# Patient Record
Sex: Male | Born: 1940
Health system: Southern US, Community
[De-identification: ages and names within clinical notes are randomized; demographics above are authoritative.]

## PROBLEM LIST (undated history)

## (undated) DIAGNOSIS — I2510 Atherosclerotic heart disease of native coronary artery without angina pectoris: Secondary | ICD-10-CM

## (undated) DIAGNOSIS — J189 Pneumonia, unspecified organism: Secondary | ICD-10-CM

## (undated) DIAGNOSIS — D696 Thrombocytopenia, unspecified: Secondary | ICD-10-CM

## (undated) DIAGNOSIS — E785 Hyperlipidemia, unspecified: Secondary | ICD-10-CM

## (undated) DIAGNOSIS — N529 Male erectile dysfunction, unspecified: Secondary | ICD-10-CM

## (undated) DIAGNOSIS — I340 Nonrheumatic mitral (valve) insufficiency: Secondary | ICD-10-CM

## (undated) DIAGNOSIS — G4700 Insomnia, unspecified: Secondary | ICD-10-CM

## (undated) DIAGNOSIS — I10 Essential (primary) hypertension: Secondary | ICD-10-CM

## (undated) DIAGNOSIS — I5042 Chronic combined systolic (congestive) and diastolic (congestive) heart failure: Secondary | ICD-10-CM

## (undated) DIAGNOSIS — R0609 Other forms of dyspnea: Secondary | ICD-10-CM

## (undated) DIAGNOSIS — I428 Other cardiomyopathies: Secondary | ICD-10-CM

## (undated) DIAGNOSIS — C61 Malignant neoplasm of prostate: Secondary | ICD-10-CM

## (undated) DIAGNOSIS — M1990 Unspecified osteoarthritis, unspecified site: Secondary | ICD-10-CM

## (undated) HISTORY — DX: Other cardiomyopathies: I42.8

## (undated) HISTORY — PX: CARDIAC CATHETERIZATION: SHX172

## (undated) HISTORY — DX: Chronic combined systolic (congestive) and diastolic (congestive) heart failure: I50.42

## (undated) HISTORY — DX: Nonrheumatic mitral (valve) insufficiency: I34.0

## (undated) HISTORY — PX: APPENDECTOMY: SHX54

## (undated) HISTORY — DX: Atherosclerotic heart disease of native coronary artery without angina pectoris: I25.10

## (undated) HISTORY — PX: COLONOSCOPY W/ POLYPECTOMY: SHX1380

---

## 2014-03-16 DIAGNOSIS — C801 Malignant (primary) neoplasm, unspecified: Secondary | ICD-10-CM

## 2014-03-16 DIAGNOSIS — C61 Malignant neoplasm of prostate: Secondary | ICD-10-CM

## 2014-03-16 HISTORY — DX: Malignant (primary) neoplasm, unspecified: C80.1

## 2014-03-16 HISTORY — DX: Malignant neoplasm of prostate: C61

## 2018-03-16 HISTORY — PX: JOINT REPLACEMENT: SHX530

## 2018-03-16 HISTORY — PX: HERNIA REPAIR: SHX51

## 2020-01-10 ENCOUNTER — Other Ambulatory Visit: Payer: Self-pay | Admitting: Student

## 2020-01-10 ENCOUNTER — Other Ambulatory Visit (HOSPITAL_COMMUNITY): Payer: Self-pay | Admitting: Student

## 2020-01-10 DIAGNOSIS — M19012 Primary osteoarthritis, left shoulder: Secondary | ICD-10-CM

## 2020-01-18 ENCOUNTER — Other Ambulatory Visit: Payer: Self-pay

## 2020-01-18 ENCOUNTER — Ambulatory Visit
Admission: RE | Admit: 2020-01-18 | Discharge: 2020-01-18 | Disposition: A | Discharge status: Home / Self Care | Payer: Medicare HMO | Source: Ambulatory Visit | Attending: Student | Admitting: Student

## 2020-01-18 DIAGNOSIS — M19012 Primary osteoarthritis, left shoulder: Secondary | ICD-10-CM | POA: Insufficient documentation

## 2020-02-19 ENCOUNTER — Other Ambulatory Visit: Payer: Self-pay | Admitting: Surgery

## 2020-02-28 ENCOUNTER — Ambulatory Visit: Payer: Medicare HMO | Admitting: Internal Medicine

## 2020-02-28 ENCOUNTER — Other Ambulatory Visit: Payer: Self-pay

## 2020-02-28 ENCOUNTER — Encounter
Admission: RE | Admit: 2020-02-28 | Discharge: 2020-02-28 | Disposition: A | Discharge status: Home / Self Care | Payer: Medicare HMO | Source: Ambulatory Visit | Attending: Surgery | Admitting: Surgery

## 2020-02-28 ENCOUNTER — Encounter: Payer: Self-pay | Admitting: Urgent Care

## 2020-02-28 ENCOUNTER — Encounter: Payer: Self-pay | Admitting: Internal Medicine

## 2020-02-28 VITALS — BP 130/72 | HR 81 | Ht 67.0 in | Wt 183.4 lb

## 2020-02-28 DIAGNOSIS — Z01810 Encounter for preprocedural cardiovascular examination: Secondary | ICD-10-CM | POA: Insufficient documentation

## 2020-02-28 DIAGNOSIS — I493 Ventricular premature depolarization: Secondary | ICD-10-CM | POA: Diagnosis not present

## 2020-02-28 DIAGNOSIS — M19012 Primary osteoarthritis, left shoulder: Secondary | ICD-10-CM | POA: Diagnosis not present

## 2020-02-28 DIAGNOSIS — I10 Essential (primary) hypertension: Secondary | ICD-10-CM | POA: Insufficient documentation

## 2020-02-28 DIAGNOSIS — R0600 Dyspnea, unspecified: Secondary | ICD-10-CM

## 2020-02-28 DIAGNOSIS — R9431 Abnormal electrocardiogram [ECG] [EKG]: Secondary | ICD-10-CM

## 2020-02-28 DIAGNOSIS — R0609 Other forms of dyspnea: Secondary | ICD-10-CM | POA: Insufficient documentation

## 2020-02-28 DIAGNOSIS — Z01818 Encounter for other preprocedural examination: Secondary | ICD-10-CM | POA: Diagnosis not present

## 2020-02-28 HISTORY — DX: Hyperlipidemia, unspecified: E78.5

## 2020-02-28 HISTORY — DX: Malignant neoplasm of prostate: C61

## 2020-02-28 HISTORY — DX: Essential (primary) hypertension: I10

## 2020-02-28 HISTORY — DX: Thrombocytopenia, unspecified: D69.6

## 2020-02-28 LAB — CBC WITH DIFFERENTIAL/PLATELET
Abs Immature Granulocytes: 0.03 10*3/uL (ref 0.00–0.07)
Basophils Absolute: 0 10*3/uL (ref 0.0–0.1)
Basophils Relative: 1 %
Eosinophils Absolute: 0.3 10*3/uL (ref 0.0–0.5)
Eosinophils Relative: 4 %
HCT: 40 % (ref 39.0–52.0)
Hemoglobin: 13.5 g/dL (ref 13.0–17.0)
Immature Granulocytes: 0 %
Lymphocytes Relative: 22 %
Lymphs Abs: 1.6 10*3/uL (ref 0.7–4.0)
MCH: 31.8 pg (ref 26.0–34.0)
MCHC: 33.8 g/dL (ref 30.0–36.0)
MCV: 94.3 fL (ref 80.0–100.0)
Monocytes Absolute: 0.7 10*3/uL (ref 0.1–1.0)
Monocytes Relative: 9 %
Neutro Abs: 4.8 10*3/uL (ref 1.7–7.7)
Neutrophils Relative %: 64 %
Platelets: 141 10*3/uL — ABNORMAL LOW (ref 150–400)
RBC: 4.24 MIL/uL (ref 4.22–5.81)
RDW: 13.9 % (ref 11.5–15.5)
WBC: 7.5 10*3/uL (ref 4.0–10.5)
nRBC: 0 % (ref 0.0–0.2)

## 2020-02-28 LAB — COMPREHENSIVE METABOLIC PANEL
ALT: 14 U/L (ref 0–44)
AST: 16 U/L (ref 15–41)
Albumin: 3.4 g/dL — ABNORMAL LOW (ref 3.5–5.0)
Alkaline Phosphatase: 55 U/L (ref 38–126)
Anion gap: 6 (ref 5–15)
BUN: 19 mg/dL (ref 8–23)
CO2: 27 mmol/L (ref 22–32)
Calcium: 9.1 mg/dL (ref 8.9–10.3)
Chloride: 107 mmol/L (ref 98–111)
Creatinine, Ser: 0.75 mg/dL (ref 0.61–1.24)
GFR, Estimated: >60 mL/min (ref >60)
Glucose, Bld: 99 mg/dL (ref 70–99)
Potassium: 3.8 mmol/L (ref 3.5–5.1)
Sodium: 140 mmol/L (ref 135–145)
Total Bilirubin: 0.9 mg/dL (ref 0.3–1.2)
Total Protein: 6.4 g/dL — ABNORMAL LOW (ref 6.5–8.1)

## 2020-02-28 LAB — URINALYSIS, ROUTINE W REFLEX MICROSCOPIC
Bilirubin Urine: NEGATIVE
Glucose, UA: NEGATIVE mg/dL
Hgb urine dipstick: NEGATIVE
Ketones, ur: NEGATIVE mg/dL
Leukocytes,Ua: NEGATIVE
Nitrite: NEGATIVE
Protein, ur: NEGATIVE mg/dL
Specific Gravity, Urine: 1.011 (ref 1.005–1.030)
pH: 6 (ref 5.0–8.0)

## 2020-02-28 LAB — TYPE AND SCREEN
ABO/RH(D): A NEG
Antibody Screen: NEGATIVE

## 2020-02-28 LAB — SURGICAL PCR SCREEN
MRSA, PCR: NEGATIVE
Staphylococcus aureus: NEGATIVE

## 2020-02-28 NOTE — Patient Instructions (Addendum)
Your procedure is scheduled on: Thursday March 07, 2020. Report to Day Surgery inside [facility] 2nd floor (stop by registration desk first). To find out your arrival time please call [phone] between 1PM - 3PM on Wednesday March 06, 2020.  Remember: Instructions that are not followed completely may result in serious medical risk,  up to and including death, or upon the discretion of your surgeon and anesthesiologist your  surgery may need to be rescheduled.     _X__ 1. Do not eat food after midnight the night before your procedure.                 No chewing gum or hard candies. You may drink clear liquids up to 2 hours                 before you are scheduled to arrive for your surgery- DO not drink clear                 liquids within 2 hours of the start of your surgery.                 Clear Liquids include:  water, apple juice without pulp, clear Gatorade, G2 or                  Gatorade Zero (avoid Red/Purple/Blue), Black Coffee or Tea (Do not add                 anything to coffee or tea).  __X__2.   Complete the "Ensure Clear Pre-surgery Clear Carbohydrate Drink" provided to you, 2 hours before arrival. **If you are diabetic you will be provided with an alternative drink, Gatorade Zero or G2.  __X__3.  On the morning of surgery brush your teeth with toothpaste and water, you                may rinse your mouth with mouthwash if you wish.  Do not swallow any toothpaste of mouthwash.     _X__ 4.  No Alcohol for 24 hours before or after surgery.   _X__ 5.  Do Not Smoke or use e-cigarettes For 24 Hours Prior to Your Surgery.                 Do not use any chewable tobacco products for at least 6 hours prior to                 Surgery.  _X__  6.  Do not use any recreational drugs (marijuana, cocaine, heroin, ecstasy, MDMA or other)                For at least one week prior to your surgery.  Combination of these drugs with anesthesia                 May have life threatening results.  __X__  7.  Notify your doctor if there is any change in your medical condition      (cold, fever, infections).     Do not wear jewelry, make-up, hairpins, clips or nail polish. Do not wear lotions, powders, or perfumes. You may wear deodorant. Do not shave 48 hours prior to surgery. Men may shave face and neck. Do not bring valuables to the hospital.    [facility] is not responsible for any belongings or valuables.  Contacts, dentures or bridgework may not be worn into surgery. Leave your suitcase in the car. After surgery it may be  brought to your room. For patients admitted to the hospital, discharge time is determined by your treatment team.   Patients discharged the day of surgery will not be allowed to drive home.   Make arrangements for someone to be with you for the first 24 hours of your Same Day Discharge.    Please read over the following fact sheets that you were given:     __X__ Take these medicines the morning of surgery with A SIP OF WATER:    1. None   ____ Fleet Enema (as directed)   __X__ Use CHG Soap (or wipes) as directed  ____ Use Benzoyl Peroxide Gel as instructed  __X__ Use nasal on the day of surgery  fluticasone (FLONASE) 50 MCG/ACT nasal spray   ____ Stop metformin 2 days prior to surgery    ____ Take 1/2 of usual insulin dose the night before surgery. No insulin the morning          of surgery.   __x__ Stop aspirin EC 81 MG as instructed by your doctor.   __x__ Stop Anti-inflammatories such as Ibuprofen, Aleve, Advil, naproxen and or BC powders.   __x__ Stop supplements until after surgery.    __x__ Do not start any herbal supplements before your procedure.   If you have any questions regarding your pre-procedure instructions,  Please call Pre-admit Testing at [phone].

## 2020-02-28 NOTE — Patient Instructions (Signed)
Medication Instructions:  Your physician recommends that you continue on your current medications as directed. Please refer to the Current Medication list given to you today.  *If you need a refill on your cardiac medications before your next appointment, please call your pharmacy*   Lab Work: None  If you have labs (blood work) drawn today and your tests are completely normal, you will receive your results only by: Marland Kitchen MyChart Message (if you have MyChart) OR . A paper copy in the mail If you have any lab test that is abnormal or we need to change your treatment, we will call you to review the results.   Testing/Procedures: 1- Your physician has requested that you have an echocardiogram.  This week or early next week. Surgery is on 03/07/20 . Echocardiography is a painless test that uses sound waves to create images of your heart. It provides your doctor with information about the size and shape of your heart and how well your heart's chambers and valves are working. This procedure takes approximately one hour. There are no restrictions for this procedure. There is a possibility that an IV may need to be started during your test to inject an image enhancing agent. This is done to obtain more optimal pictures of your heart. Therefore we ask that you do at least drink some water prior to coming in to hydrate your veins.    2- Your physician has requested that you have a lexiscan myoview.  This week or early next week.   [place]  Your caregiver has ordered a Stress Test with nuclear imaging. The purpose of this test is to evaluate the blood supply to your heart muscle. This procedure is referred to as a "Non-Invasive Stress Test." This is because other than having an IV started in your vein, nothing is inserted or "invades" your body. Cardiac stress tests are done to find areas of poor blood flow to the heart by determining the extent of coronary artery disease (CAD). Some patients exercise  on a treadmill, which naturally increases the blood flow to your heart, while others who are  unable to walk on a treadmill due to physical limitations have a pharmacologic/chemical stress agent called Lexiscan . This medicine will mimic walking on a treadmill by temporarily increasing your coronary blood flow.   Please note: these test may take anywhere between 2-4 hours to complete  PLEASE REPORT TO [place] AT THE FIRST DESK WILL DIRECT YOU WHERE TO GO  Date of Procedure:_____________________________________  Arrival Time for Procedure:______________________________   PLEASE NOTIFY THE OFFICE AT LEAST 24 HOURS IN ADVANCE IF YOU ARE UNABLE TO KEEP YOUR APPOINTMENT.  [phone] AND  PLEASE NOTIFY NUCLEAR MEDICINE AT [facility] AT LEAST 24 HOURS IN ADVANCE IF YOU ARE UNABLE TO KEEP YOUR APPOINTMENT. [phone]  How to prepare for your Myoview test:  1. Do not eat or drink after midnight 2. No caffeine for 24 hours prior to test 3. No smoking 24 hours prior to test. 4. Your medication may be taken with water.  If your doctor stopped a medication because of this test, do not take that medication. 5. Please wear a short sleeve shirt. 6. No perfume, cologne or lotion. 7. Wear comfortable walking shoes.   Follow-Up: At [facility], you and your health needs are our priority.  As part of our continuing mission to provide you with exceptional heart care, we have created designated Provider Care Teams.  These Care Teams  include your primary Cardiologist (physician) and Advanced Practice Providers (APPs -  Physician Assistants and Nurse Practitioners) who all work together to provide you with the care you need, when you need it.  We recommend signing up for the patient portal called "MyChart".  Sign up information is provided on this After Visit Summary.  MyChart is used to connect with patients for Virtual Visits (Telemedicine).  Patients are able to view  lab/test results, encounter notes, upcoming appointments, etc.  Non-urgent messages can be sent to your provider as well.   To learn more about what you can do with MyChart, go to [email or web address].    Your next appointment:   After testing  The format for your next appointment:   In Person  Provider:   You may see DR Harrell Gave END or one of the following Advanced Practice Providers on your designated Care Team:    Murray Hodgkins, NP  Christell Faith, PA-C  Marrianne Mood, PA-C  Cadence Kathlen Mody, [place]  Laurann Montana, NP   Cardiac Nuclear Scan A cardiac nuclear scan is a test that measures blood flow to the heart when a person is resting and when he or she is exercising. The test looks for problems such as:  Not enough blood reaching a portion of the heart.  The heart muscle not working normally. You may need this test if:  You have heart disease.  You have had abnormal lab results.  You have had heart surgery or a balloon procedure to open up blocked arteries (angioplasty).  You have chest pain.  You have shortness of breath. In this test, a radioactive dye (tracer) is injected into your bloodstream. After the tracer has traveled to your heart, an imaging device is used to measure how much of the tracer is absorbed by or distributed to various areas of your heart. This procedure is usually done at a hospital and takes 2-4 hours. Tell a health care provider about:  Any allergies you have.  All medicines you are taking, including vitamins, herbs, eye drops, creams, and over-the-counter medicines.  Any problems you or family members have had with anesthetic medicines.  Any blood disorders you have.  Any surgeries you have had.  Any medical conditions you have.  Whether you are pregnant or may be pregnant. What are the risks? Generally, this is a safe procedure. However, problems may occur, including:  Serious chest pain and heart attack. This is only a  risk if the stress portion of the test is done.  Rapid heartbeat.  Sensation of warmth in your chest. This usually passes quickly.  Allergic reaction to the tracer. What happens before the procedure?  Ask your health care provider about changing or stopping your regular medicines. This is especially important if you are taking diabetes medicines or blood thinners.  Follow instructions from your health care provider about eating or drinking restrictions.  Remove your jewelry on the day of the procedure. What happens during the procedure?  An IV will be inserted into one of your veins.  Your health care provider will inject a small amount of radioactive tracer through the IV.  You will wait for 20-40 minutes while the tracer travels through your bloodstream.  Your heart activity will be monitored with an electrocardiogram (ECG).  You will lie down on an exam table.  Images of your heart will be taken for about 15-20 minutes.  You may also have a stress test. For this test, one of the following may be done: ?  You will exercise on a treadmill or stationary bike. While you exercise, your heart's activity will be monitored with an ECG, and your blood pressure will be checked. ? You will be given medicines that will increase blood flow to parts of your heart. This is done if you are unable to exercise.  When blood flow to your heart has peaked, a tracer will again be injected through the IV.  After 20-40 minutes, you will get back on the exam table and have more images taken of your heart.  Depending on the type of tracer used, scans may need to be repeated 3-4 hours later.  Your IV line will be removed when the procedure is over. The procedure may vary among health care providers and hospitals. What happens after the procedure?  Unless your health care provider tells you otherwise, you may return to your normal schedule, including diet, activities, and medicines.  Unless your  health care provider tells you otherwise, you may increase your fluid intake. This will help to flush the contrast dye from your body. Drink enough fluid to keep your urine pale yellow.  Ask your health care provider, or the department that is doing the test: ? When will my results be ready? ? How will I get my results? Summary  A cardiac nuclear scan measures the blood flow to the heart when a person is resting and when he or she is exercising.  Tell your health care provider if you are pregnant.  Before the procedure, ask your health care provider about changing or stopping your regular medicines. This is especially important if you are taking diabetes medicines or blood thinners.  After the procedure, unless your health care provider tells you otherwise, increase your fluid intake. This will help flush the contrast dye from your body.  After the procedure, unless your health care provider tells you otherwise, you may return to your normal schedule, including diet, activities, and medicines. This information is not intended to replace advice given to you by your health care provider. Make sure you discuss any questions you have with your health care provider. Document Revised: 08/16/2017 Document Reviewed: 08/16/2017 Elsevier Patient Education  [employer].

## 2020-02-28 NOTE — Progress Notes (Signed)
New Outpatient Visit Date: 02/28/2020  Referring Provider: Honor Loh, NP [facility]  Chief Complaint: Preoperative evaluation  HPI:  [name] is a 79 y.o. male who is being seen today for preoperative cardiovascular evaluation at the request of Honor Loh, NP. He has a history of hypertension, hyperlipidemia, and prostate cancer.  [name] is scheduled to undergo left total shoulder arthroplasty with Dr. Roland Rack next week (03/07/2020).  He was evaluated in the preoperative anesthesiology clinic today, where he mentioned an episode of exertional dyspnea while at the gym as well as associated chest tightness.  He was also found to have an abnormal EKG, prompting urgent cardiology referral.  [name] reports feeling fairly well.  He exercises regularly on an elliptical trainer.  He typically does not have limiting symptoms but notes one episode a few weeks ago where he started off faster than normal and felt like he was quite out of breath.  On my questioning, he denies chest pain/pressure at that time and on other occasions.  He also denies palpitations, lightheadedness, and edema.  He reports undergoing an exercise tolerance test 30 to 35 years ago and believes it was normal.  He has undergone several prior surgeries without cardiac complications.  --------------------------------------------------------------------------------------------------  Cardiovascular History & Procedures: Cardiovascular Problems:  Dyspnea on exertion  Abnormal EKG  Risk Factors:  Hypertension, hyperlipidemia, male gender, and age greater than 47  Cath/PCI:  None  CV Surgery:  None  EP Procedures and Devices:  None  Non-Invasive Evaluation(s):  None available.  Patient reports normal ETT 30-35 years ago.  Recent CV Pertinent Labs: Lab Results  Component Value Date   K 3.8 02/28/2020   BUN 19 02/28/2020   CREATININE 0.75 02/28/2020     --------------------------------------------------------------------------------------------------  Past Medical History:  Diagnosis Date  . Cancer [facility]) 2016   prostate cancer   . HLD (hyperlipidemia)   . Hypertension   . Prostate cancer ([place])   . Thrombocytopenia ([place])     Past Surgical History:  Procedure Laterality Date  . HERNIA REPAIR  2020  . JOINT REPLACEMENT  2020    Current Meds  Medication Sig  . acetaminophen (TYLENOL) 500 MG tablet Take 1,000 mg by mouth every 6 (six) hours as needed (for pain.).  Marland Kitchen aspirin EC 81 MG tablet Take 81 mg by mouth daily. Swallow whole.  . calcium carbonate (OSCAL) 1500 (600 Ca) MG TABS tablet Take 600 mg by mouth daily.  . Coenzyme Q10 (COQ10) 50 MG CAPS Take 50 mg by mouth at bedtime.  . fluticasone (FLONASE) 50 MCG/ACT nasal spray Place 1 spray into both nostrils daily.  Marland Kitchen lisinopril-hydrochlorothiazide (ZESTORETIC) 20-12.5 MG tablet Take 1 tablet by mouth daily.  . Multiple Vitamin (MULTIVITAMIN WITH MINERALS) TABS tablet Take 1 tablet by mouth daily.  . Multiple Vitamins-Minerals (MEGAVITE FRUITS & VEGGIES) TABS Take by mouth.  . simvastatin (ZOCOR) 20 MG tablet Take 20 mg by mouth at bedtime.    Allergies: Amoxicillin  Social History   Tobacco Use  . Smoking status: Never Smoker  . Smokeless tobacco: Never Used  Substance Use Topics  . Alcohol use: Never  . Drug use: Never    Family History  Problem Relation Age of Onset  . Heart Problems Mother   . Heart Problems Sister     Review of Systems: A 12-system review of systems was performed and was negative except as noted in the HPI.  --------------------------------------------------------------------------------------------------  Physical Exam: BP 130/72 (BP Location: Right Arm,  Patient Position: Sitting, Cuff Size: Normal)   Pulse 81   Ht 5\' 7"  (1.702 m)   Wt 183 lb 6 oz (83.2 kg)   SpO2 98%   BMI 28.72 kg/m   General: NAD.  Accompanied by his  wife. HEENT: No conjunctival pallor or scleral icterus. Facemask in place. Neck: Supple without lymphadenopathy, thyromegaly, JVD, or HJR. No carotid bruit. Lungs: Normal work of breathing. Clear to auscultation bilaterally without wheezes or crackles. Heart: Regular rate and rhythm with occasional extrasystoles.  No rubs or gallops.  Nondisplaced PMI. Abd: Bowel sounds present. Soft, NT/ND without hepatosplenomegaly Ext: No lower extremity edema. Radial, PT, and DP pulses are 2+ bilaterally Skin: Warm and dry without rash. Neuro: CNIII-XII intact. Strength and fine-touch sensation intact in upper and lower extremities bilaterally. Psych: Normal mood and affect.  EKG: Normal sinus rhythm with PVCs, first-degree AV block, left axis deviation, and nonspecific intraventricular conduction delay.  Lab Results  Component Value Date   WBC 7.5 02/28/2020   HGB 13.5 02/28/2020   HCT 40.0 02/28/2020   MCV 94.3 02/28/2020   PLT 141 (L) 02/28/2020    Lab Results  Component Value Date   NA 140 02/28/2020   K 3.8 02/28/2020   CL 107 02/28/2020   CO2 27 02/28/2020   BUN 19 02/28/2020   CREATININE 0.75 02/28/2020   GLUCOSE 99 02/28/2020   ALT 14 02/28/2020    No results found for: CHOL, HDL, LDLCALC, LDLDIRECT, TRIG, CHOLHDL   --------------------------------------------------------------------------------------------------  ASSESSMENT AND PLAN: Dyspnea on exertion and abnormal EKG: Overall, [name] has fairly good functional capacity but notes an episode of exertional dyspnea while exercising a few weeks ago.  His examination today is notable for occasional extrasystoles, which likely reflect PVCs as noted on today's EKG.  His EKG also shows first-degree AV block, left axis deviation, and nonspecific intraventricular conduction delay.  Tracing is similar to the EKG from earlier today; unfortunately no older tracings are available for direct comparison.  EKG through [facility] in  02/2019 makes note of sinus rhythm with frequent PVCs, bifascicular block, LVH, and possible septal infarct.  Given [name]'s multiple cardiac risk factors and abnormal EKG, I have recommended obtaining an echocardiogram and myocardial perfusion stress test before proceeding with elective surgery.  In the meantime, he should continue his current medications.  Preoperative cardiovascular risk assessment: [name] is usually able to perform more than 4 METS, though he notes an episode of significant dyspnea while exercising, as outlined above.  In light of this and his abnormal EKG, I have recommended that we obtain a transthoracic echocardiogram and pharmacologic myocardial perfusion stress test prior to proceeding with elective surgery.  If there are no high risk features on either study, I think it would be reasonable for [name] to proceed without additional testing/intervention.  Hypertension: Blood pressure upper normal today.  I will defer ongoing management to his PCP.  Follow-up: Return to clinic as needed based on results of aforementioned testing.  Nelva Bush, MD 02/28/2020 8:34 PM

## 2020-02-28 NOTE — Progress Notes (Signed)
  [facility] Perioperative Services: Pre-Admission/Anesthesia Testing     Date: 02/28/20  Name: Larry Allen MRN:   [id number]  Re: Need for cardiac clearance prior to surgery    Case: [id number] Date/Time: 03/07/20 0715   Procedure: TOTAL SHOULDER ARTHROPLASTY (Left Shoulder)   Anesthesia type: Choice   Pre-op diagnosis: Primary osteoarthritis of left shoulder M19.012   Location: [facility] OR ROOM 03 / [facility]ESTHESIA [facility]   Surgeons: Corky Mull, MD    Patient scheduled for the above procedure on 03/07/2020 with Dr. Milagros Evener. As part of his pre-operative assessment, patient presented to PAT today (02/28/2020) and a 12 lead ECG was performed.    ECG revealed NSR at a rate of 73 bpm with frequent PVCs, LVH with QRS widening, and a repolarization abnormality.    No previous tracings available for review, however there is narrative interpretation of a preoperative ECG performed at [facility] on 03/02/2019 that showed NSR with frequent PVCs, RBBB, LAFB, LVH with secondary repolarization abnormality, and a possible septal infarction.   Patient denies PMH significant for any known cardiovascular issues; does have HTN and HLD.    Patient reports exertional dyspnea and chest "tightness" if with rapid overexertion. He reports that if he walks on his treadmill too quickly, the aforementioned symptoms are elicited, however if he "starts off slow" the symptoms are reported to be much milder in severity. Patient denies having ever been seen in consult by cardiology in the past.   While the interpretation of his ECG does not seem to be significantly changed from the one that he has in the past, the fact that he is symptomatic with exertion is concerning, especially given the fact that he is scheduled for an elective orthopedic procedure in the near future. Call placed to [facility] in efforts to secure patient an appointment to be seen for further evaluation and  preoperative clearance. Patient able to be seen TODAY at 1515 by Dr. Harrell Gave End. Copy of this note forwarded to attending orthopedic surgeon Roland Rack, MD) in efforts to keep him apprised of the details related to this patient and his perioperative evaluation. No changes being made to the surgical schedule at this time, however with that being said, cardiology may elect to pursue further functional and perfusion studies given the patient's age and symptom constellation. Will plan on communicating further details to surgeon's office following evaluation by cardiology.       Honor Loh, MSN, APRN, FNP-C, CEN [facility]  Peri-operative Services Nurse Practitioner Phone: [phone] 02/28/20 12:34 PM

## 2020-02-29 ENCOUNTER — Ambulatory Visit (INDEPENDENT_AMBULATORY_CARE_PROVIDER_SITE_OTHER): Payer: Medicare HMO

## 2020-02-29 DIAGNOSIS — R9431 Abnormal electrocardiogram [ECG] [EKG]: Secondary | ICD-10-CM | POA: Diagnosis not present

## 2020-02-29 DIAGNOSIS — R0609 Other forms of dyspnea: Secondary | ICD-10-CM

## 2020-02-29 DIAGNOSIS — R0600 Dyspnea, unspecified: Secondary | ICD-10-CM

## 2020-02-29 LAB — ECHOCARDIOGRAM COMPLETE
Area-P 1/2: 3.4 cm2
S' Lateral: 3.7 cm

## 2020-02-29 NOTE — Progress Notes (Addendum)
  [facility] Perioperative Services: Pre-Admission/Anesthesia Testing     Date: 02/29/20  Name: Larry Allen MRN:   [id number]  Re: Need for cardiac testing/clearance prior to elective surgery   Case: [id number] Date/Time: 03/07/20 0715   Procedure: TOTAL SHOULDER ARTHROPLASTY (Left Shoulder)   Anesthesia type: Choice   Pre-op diagnosis: Primary osteoarthritis of left shoulder M19.012   Location: [facility] OR ROOM 03 / [place] ORS FOR ANESTHESIA GROUP   Surgeons: Corky Mull, MD    Patient scheduled to undergo the above procedure on 03/07/2020 with Dr. Milagros Evener.  In preparation for his procedure, patient was seen in the PAT clinic and found to have an abnormal EKG.  He was subsequently referred to cardiology for further evaluation.  In efforts to proceed with his surgery as planned, patient was seen and expedited consult on 02/28/2020 by Dr. Harrell Gave End; notes reviewed.  At the time of his clinic visit, patient reporting that he is doing well overall from a cardiovascular standpoint.  He did advise cardiology of the episodes of exertional shortness of breath and chest tightness associated with exercise. DASI defined functional capacity is normally > 4 METS. Patient denied symptoms at rest.  No recent palpitations, vertiginous symptoms, or peripheral edema.  ECG repeated in the office revealing normal sinus rhythm with PVCs, first-degree AV block, left axis deviation, and a nonspecific IVCD.  Patient with multiple cardiac risk factors, therefore recommendations were for TEE and myocardial perfusion imaging study prior to proceeding with elective surgery.  At this time, cardiology testing and evaluation is scheduled as follows:   TTE scheduled for 02/29/2020   Myocardial perfusion imaging study scheduled for 03/04/2020   Follow-up with outpatient cardiology to review results and recommendations on 03/05/2020  Plan of care will be communicated to primary attending  surgeons office.  I will continue to follow patient throughout course of preoperative cardiovascular testing and evaluation and communicate any changes to Dr. Nicholaus Bloom office. No changes have been made to the surgical schedule at this point. Assuming normal cardiovascular testing results, this patient should be suitable to proceed with planned surgical intervention on 03/07/2020.   Honor Loh, MSN, APRN, FNP-C, CEN [facility]  Peri-operative Services Nurse Practitioner Phone: [phone] 02/29/20 8:36 AM

## 2020-03-01 ENCOUNTER — Telehealth: Payer: Self-pay | Admitting: *Deleted

## 2020-03-01 ENCOUNTER — Other Ambulatory Visit: Payer: Medicare HMO

## 2020-03-01 NOTE — Telephone Encounter (Signed)
Patient called back and he verbalized understanding of his echo results.

## 2020-03-04 ENCOUNTER — Ambulatory Visit
Admission: RE | Admit: 2020-03-04 | Discharge: 2020-03-04 | Disposition: A | Discharge status: Home / Self Care | Payer: Medicare HMO | Source: Ambulatory Visit | Attending: Internal Medicine | Admitting: Internal Medicine

## 2020-03-04 ENCOUNTER — Other Ambulatory Visit: Payer: Self-pay

## 2020-03-04 DIAGNOSIS — R0600 Dyspnea, unspecified: Secondary | ICD-10-CM | POA: Insufficient documentation

## 2020-03-04 DIAGNOSIS — R9431 Abnormal electrocardiogram [ECG] [EKG]: Secondary | ICD-10-CM | POA: Insufficient documentation

## 2020-03-04 DIAGNOSIS — R0609 Other forms of dyspnea: Secondary | ICD-10-CM

## 2020-03-04 LAB — NM MYOCAR MULTI W/SPECT W/WALL MOTION / EF
Estimated workload: 1 METS
Exercise duration (min): 0 min
Exercise duration (sec): 0 s
LV dias vol: 129 mL (ref 62–150)
LV sys vol: 60 mL
MPHR: 143 {beats}/min
Peak HR: 103 {beats}/min
Percent HR: 72 %
Rest HR: 68 {beats}/min
SDS: 2
SRS: 12
SSS: 12
TID: 0.81

## 2020-03-04 MED ORDER — TECHNETIUM TC 99M TETROFOSMIN IV KIT
10.0000 | PACK | Freq: Once | INTRAVENOUS | Status: AC | PRN
  Administered 2020-03-04: 09:00:00 10.36 via INTRAVENOUS

## 2020-03-04 MED ORDER — REGADENOSON 0.4 MG/5ML IV SOLN
0.4000 mg | Freq: Once | INTRAVENOUS | Status: AC
  Administered 2020-03-04: 10:00:00 0.4 mg via INTRAVENOUS

## 2020-03-04 MED ORDER — TECHNETIUM TC 99M TETROFOSMIN IV KIT
29.0000 | PACK | Freq: Once | INTRAVENOUS | Status: AC | PRN
  Administered 2020-03-04: 10:00:00 29 via INTRAVENOUS

## 2020-03-05 ENCOUNTER — Other Ambulatory Visit: Payer: Medicare HMO

## 2020-03-05 ENCOUNTER — Ambulatory Visit (INDEPENDENT_AMBULATORY_CARE_PROVIDER_SITE_OTHER): Payer: Medicare HMO | Admitting: Family

## 2020-03-05 ENCOUNTER — Encounter: Payer: Self-pay | Admitting: Family

## 2020-03-05 ENCOUNTER — Other Ambulatory Visit
Admission: RE | Admit: 2020-03-05 | Discharge: 2020-03-05 | Disposition: A | Discharge status: Home / Self Care | Payer: Medicare HMO | Source: Ambulatory Visit | Attending: Surgery | Admitting: Surgery

## 2020-03-05 VITALS — BP 112/72 | HR 89 | Ht 68.0 in | Wt 180.0 lb

## 2020-03-05 DIAGNOSIS — Z01812 Encounter for preprocedural laboratory examination: Secondary | ICD-10-CM | POA: Diagnosis present

## 2020-03-05 DIAGNOSIS — E785 Hyperlipidemia, unspecified: Secondary | ICD-10-CM

## 2020-03-05 DIAGNOSIS — I2510 Atherosclerotic heart disease of native coronary artery without angina pectoris: Secondary | ICD-10-CM

## 2020-03-05 DIAGNOSIS — R9439 Abnormal result of other cardiovascular function study: Secondary | ICD-10-CM

## 2020-03-05 DIAGNOSIS — I340 Nonrheumatic mitral (valve) insufficiency: Secondary | ICD-10-CM | POA: Diagnosis not present

## 2020-03-05 DIAGNOSIS — Z20822 Contact with and (suspected) exposure to covid-19: Secondary | ICD-10-CM | POA: Insufficient documentation

## 2020-03-05 LAB — SARS CORONAVIRUS 2 (TAT 6-24 HRS): SARS Coronavirus 2: NEGATIVE

## 2020-03-05 NOTE — H&P (View-Only) (Signed)
 Office Visit    Patient Name: Larry Allen Date of Encounter: 03/05/2020  Primary Care Provider:  Kalish, Michael, MD Primary Cardiologist:  Christopher End, MD Electrophysiologist:  None   Chief Complaint    Larry Allen is a 79 y.o. male with a hx of  coronary artery calcification on CT scan, abnormal lexiscan myoview, mildly reduced LVEF, hypertension, hyperlipidemia, prostate cancer presents today for follow up after Lexiscan Myoview.    Past Medical History    Past Medical History:  Diagnosis Date  . Cancer (HCC) 2016   prostate cancer   . HLD (hyperlipidemia)   . Hypertension   . Prostate cancer (HCC)   . Thrombocytopenia (HCC)    Past Surgical History:  Procedure Laterality Date  . APPENDECTOMY    . HERNIA REPAIR  2020  . JOINT REPLACEMENT  2020    Allergies  Allergies  Allergen Reactions  . Amoxicillin Swelling and Rash    History of Present Illness    [name] is a 79 y.o. male with a hx of coronary artery calcification on CT scan, abnormal lexiscan myoview, mildly reduced LVEF, hypertension, hyperlipidemia, prostate cancer last seen 02/28/20 by Dr. End.  Seen in consult 02/23/2020 for preoperative clearance for left total shoulder arthroplasty by Dr. End.  He was seen in preoperative anesthesiology clinic and noted episode of exertional dyspnea along with abnormal EKG.  When seen by Dr. End he noted isolated episode of starting off his exercise faster than normal and felt quite dyspneic.  No chest pain, pressure, tightness.  Previous exercise tolerance test 30 to 35 years ago which was normal per his report.  EKG showed first-degree AV block, left axis deviation, nonspecific intraventricular conduction delay along with PVC.  He was recommended for echocardiogram and stress test.  Echo 02/29/20 LVEF 40-45%, LV global hypokinesis, unable to exclude wall motion abnormality due to ectopy and bundle branch block, gr1DD, mild to moderate MR. Noted  challenging images. Lexiscan 03/04/20 with small defect of moderate severity present in apical inferior location suggestive of prior small infarct. CT attenuation images with mild coronary calcifications especially in RCA. Estimated LVEF 45-54%. Deemed intermediate risk study.   Presents today for follow up with his wife. We reviewed echo and lexiscan in depth and indication for further ischemic evaluation. He is understandably upset his shoulder surgery has to be delayed, but understanding. He reports no chest pain, pressure, tightness. Reports no dyspnea on exertion nor shortness of breath. He has a strong family history of coronary disease.   EKGs/Labs/Other Studies Reviewed:   The following studies were reviewed today:  Lexiscan Myoview 03/04/20 There was no ST segment deviation noted during stress. EKG is not interpretable due to IVCD. Frequent PVCs noted. Defect 1: There is a small defect of moderate severity present in the apical inferior location. This is suggestive of prior small infarct. The left ventricular ejection fraction is mildly decreased (45-54%). This is an intermediate risk study. CT attenuation images shows mild coronary calcifications especially in the RCA distribution.  Echo 02/29/20 1. Left ventricular ejection fraction, by estimation, is 40 to 45%. The  left ventricle has mildly decreased function. The left ventricle  demonstrates global hypokinesis.Frequent ectopy and bundle branch block  noted, unable to exclude regional wall  motion abnormality. Left ventricular diastolic parameters are consistent  with Grade I diastolic dysfunction (impaired relaxation).   2. Right ventricular systolic function is normal. The right ventricular  size is normal. There is mildly elevated pulmonary artery systolic    pressure. The estimated right ventricular systolic pressure is [id number] mmHg.   3. The mitral valve is normal in structure. Mild to moderate mitral valve  regurgitation.    4. Challenging images.    EKG:  NO EKG is ordered today.  The ekg independently reviewed from 02/28/20 demonstrated NSR 77 bp with first degree AV block (PR 214), occasional PVC and IVCD  Recent Labs: 02/28/2020: ALT 14; BUN 19; Creatinine, Ser 0.75; Hemoglobin 13.5; Platelets 141; Potassium 3.8; Sodium 140  Recent Lipid Panel No results found for: CHOL, TRIG, HDL, CHOLHDL, VLDL, LDLCALC, LDLDIRECT  Home Medications   Current Meds  Medication Sig  . acetaminophen (TYLENOL) 500 MG tablet Take 1,000 mg by mouth every 6 (six) hours as needed (for pain.).  Marland Kitchen aspirin EC 81 MG tablet Take 81 mg by mouth daily. Swallow whole.  . calcium carbonate (OSCAL) 1500 (600 Ca) MG TABS tablet Take 600 mg by mouth daily.  . Coenzyme Q10 (COQ10) 50 MG CAPS Take 50 mg by mouth at bedtime.  . fluticasone (FLONASE) 50 MCG/ACT nasal spray Place 1 spray into both nostrils daily.  Marland Kitchen lisinopril-hydrochlorothiazide (ZESTORETIC) 20-12.5 MG tablet Take 1 tablet by mouth daily.  . Multiple Vitamin (MULTIVITAMIN WITH MINERALS) TABS tablet Take 1 tablet by mouth daily.  . Multiple Vitamins-Minerals (MEGAVITE FRUITS & VEGGIES) TABS Take by mouth daily.  . simvastatin (ZOCOR) 20 MG tablet Take 20 mg by mouth at bedtime.    Review of Systems  All other systems reviewed and are otherwise negative except as noted above.  Physical Exam    VS:  BP 112/72 (BP Location: Left Arm, Patient Position: Sitting, Cuff Size: Normal)   Pulse 89   Ht 5\' 8"  (1.727 m)   Wt 180 lb (81.6 kg)   SpO2 98%   BMI 27.37 kg/m  , BMI Body mass index is 27.37 kg/m.  Wt Readings from Last 3 Encounters:  03/05/20 180 lb (81.6 kg)  02/28/20 181 lb 8 oz (82.3 kg)  02/28/20 183 lb 6 oz (83.2 kg)     GEN: Well nourished, well developed, in no acute distress. HEENT: normal. Neck: Supple, no JVD, carotid bruits, or masses. Cardiac: RRR, no murmurs, rubs, or gallops. No clubbing, cyanosis, edema.  Radials/DP/PT 2+ and equal bilaterally.   Respiratory:  Respirations regular and unlabored, clear to auscultation bilaterally. GI: Soft, nontender, nondistended. MS: No deformity or atrophy. Skin: Warm and dry, no rash. Neuro:  Strength and sensation are intact. Psych: Normal affect.  Assessment & Plan    1. Abnormal Lexiscan Myoview / Abnormal EKG -EKG 02/26/2020 with sinus rhythm with first-degree AV block, left axis deviation, nonspecific intraventricular conduction delay with occasional PVC.  Echo 02/29/2020 LVEF [id number], grade 1 diastolic dysfunction, mildly elevated PASP, mild to moderate MR.  Lexiscan Myoview with small defect of moderate severity in apical inferior location suggestive of prior small infarct with CT attenuation images showing mild coronary calcifications distally in RCA.  Intermediate risk study.  Need for further ischemic evaluation with cardiac catheterization.The risks [stroke (1 in 1000), death (1 in 12), kidney failure [usually temporary] (1 in 500), bleeding (1 in 200), allergic reaction [possibly serious] (1 in 200)], benefits (diagnostic support and management of coronary artery disease) and alternatives of a cardiac catheterization were discussed in detail with Mr. Can and he is willing to proceed. He will continue his Aspirin 81mg  daily.   2. ?HFrEF - Echo 02/29/20 with LVEF suggestive of LVEF 40-45% however noted to be mildly better based  on Dr. Darnelle Bos review. Lexiscan with small defect of moderate severity and estimated LVEF 45-54%. Plan for cardiac cath, as above which will allow for better assessment of LV function. Present GDMT includes Lisinopril. No symptoms of heart failure at this time - no orthopnea, PND, shortness of breath, edema.   3. Mild to moderate MR -by echo 02/29/2020.  Continue to monitor with periodic echo.  4. HTN- BP well controlled. Continue current antihypertensive regimen.   5. HLD, LDL goal <70 - Continue Simvastatin as prescribed by primary care. Will need to request most  recent lipid panel from PCP. Pending results of cardiac cath may need transition to higher potency statin.   6. Preoperative cardiovascular risk assessment - He will need to complete ischemic work-up with cardiac catheterization prior to his left shoulder arthroplasty.  He was made aware today in clinic and understanding.  Surgeon made aware as well.  Disposition: Cardiac cath scheduled 03/12/20. Follow up 1 week after cardiac catheterization with Dr. Saunders Revel or APP.   Signed, Loel Dubonnet, NP 03/05/2020, 6:22 PM [place]

## 2020-03-05 NOTE — Progress Notes (Signed)
  [facility] Perioperative Services: Pre-Admission/Anesthesia Testing     Date: 03/05/20  Name: Larry Allen MRN:   [id number]  Re: Need for further cardiac testing prior to surgery   Case: [id number] Date/Time: 03/07/20 0715   Procedure: TOTAL SHOULDER ARTHROPLASTY (Left Shoulder)   Anesthesia type: Choice   Pre-op diagnosis: Primary osteoarthritis of left shoulder M19.012   Location: [facility] OR ROOM 03 / [place] ORS FOR ANESTHESIA GROUP   Surgeons: Corky Mull, MD    Patient scheduled for the above procedure on 03/07/2020 with Dr. Milagros Evener.  Patient was seen in the PAT clinic on 02/29/2020 and found to have an abnormal EKG.  Patient was referred to cardiology for further evaluation and testing.     Patient underwent TTE on 02/29/2020 that revealed a mildly reduced left ventricular systolic function; LVEF 00-86%.  There was left ventricular global hypokinesis, frequent ectopy, and a bundle branch block noted.  The decision was made to proceed with a myocardial perfusion imaging study for further evaluation.    Patient underwent myocardial perfusion imaging study (Lexiscan) on 03/04/2020 that revealed a mildly reduced left ventricular ejection fraction of 45-54%.  There was no ST segment deviation noted during stress, however ECG was not interpretable due to IVCD; frequent PVCs noted.  There was a small defect of moderate severity present in the apical inferior location suggestive of a prior small infarct.   Patient was seen in follow-up consult on 03/05/2020 by Laurann Montana, NP-C.  Following his clinic visit, cardiology provider contacted me directly advising of abnormal TTE and myocardial perfusion imaging study.  Recommendations are for diagnostic cardiac catheterization for further evaluation of the findings present on cardiovascular testing thus far.  Patient is scheduled for the cardiac catheterization on 03/12/2020, and he will follow-up with cardiology provider  1 week later.  Dr. Saunders Revel and Dr. Roland Rack were made aware by cardiology provider.  Copy of this note forwarded to attending surgeons office for communication/information purposes.  Patient has been made aware that his surgery has been postponed at this point pending further cardiac evaluation and treatment.  Honor Loh, MSN, APRN, FNP-C, CEN [facility]  Peri-operative Services Nurse Practitioner Phone: [phone] 03/05/20 11:10 AM

## 2020-03-05 NOTE — Progress Notes (Signed)
Office Visit    Patient Name: Larry Allen Date of Encounter: 03/05/2020  Primary Care Provider:  Jefm Petty, MD Primary Cardiologist:  Nelva Bush, MD Electrophysiologist:  None   Chief Complaint    Larry Allen is a 79 y.o. male with a hx of  coronary artery calcification on CT scan, abnormal lexiscan myoview, mildly reduced LVEF, hypertension, hyperlipidemia, prostate cancer presents today for follow up after Lexiscan Myoview.    Past Medical History    Past Medical History:  Diagnosis Date  . Cancer [facility]) 2016   prostate cancer   . HLD (hyperlipidemia)   . Hypertension   . Prostate cancer ([place])   . Thrombocytopenia ([place])    Past Surgical History:  Procedure Laterality Date  . APPENDECTOMY    . HERNIA REPAIR  2020  . JOINT REPLACEMENT  2020    Allergies  Allergies  Allergen Reactions  . Amoxicillin Swelling and Rash    History of Present Illness    Larry Allen is a 79 y.o. male with a hx of coronary artery calcification on CT scan, abnormal lexiscan myoview, mildly reduced LVEF, hypertension, hyperlipidemia, prostate cancer last seen 02/28/20 by Dr. Saunders Revel.  Seen in consult 02/23/2020 for preoperative clearance for left total shoulder arthroplasty by Dr. Saunders Revel.  He was seen in preoperative anesthesiology clinic and noted episode of exertional dyspnea along with abnormal EKG.  When seen by Dr. Saunders Revel he noted isolated episode of starting off his exercise faster than normal and felt quite dyspneic.  No chest pain, pressure, tightness.  Previous exercise tolerance test 30 to 35 years ago which was normal per his report.  EKG showed first-degree AV block, left axis deviation, nonspecific intraventricular conduction delay along with PVC.  He was recommended for echocardiogram and stress test.  Echo 02/29/20 LVEF 40-45%, LV global hypokinesis, unable to exclude wall motion abnormality due to ectopy and bundle branch block, gr1DD, mild to moderate MR. Noted  challenging images. Lexiscan 03/04/20 with small defect of moderate severity present in apical inferior location suggestive of prior small infarct. CT attenuation images with mild coronary calcifications especially in RCA. Estimated LVEF 45-54%. Deemed intermediate risk study.   Presents today for follow up with his wife. We reviewed echo and lexiscan in depth and indication for further ischemic evaluation. He is understandably upset his shoulder surgery has to be delayed, but understanding. He reports no chest pain, pressure, tightness. Reports no dyspnea on exertion nor shortness of breath. He has a strong family history of coronary disease.   EKGs/Labs/Other Studies Reviewed:   The following studies were reviewed today:  Lexiscan Myoview 03/04/20 There was no ST segment deviation noted during stress. EKG is not interpretable due to IVCD. Frequent PVCs noted. Defect 1: There is a small defect of moderate severity present in the apical inferior location. This is suggestive of prior small infarct. The left ventricular ejection fraction is mildly decreased (45-54%). This is an intermediate risk study. CT attenuation images shows mild coronary calcifications especially in the RCA distribution.  Echo 02/29/20 1. Left ventricular ejection fraction, by estimation, is 40 to 45%. The  left ventricle has mildly decreased function. The left ventricle  demonstrates global hypokinesis.Frequent ectopy and bundle branch block  noted, unable to exclude regional wall  motion abnormality. Left ventricular diastolic parameters are consistent  with Grade I diastolic dysfunction (impaired relaxation).   2. Right ventricular systolic function is normal. The right ventricular  size is normal. There is mildly elevated pulmonary artery systolic  pressure. The estimated right ventricular systolic pressure is [id number] mmHg.   3. The mitral valve is normal in structure. Mild to moderate mitral valve  regurgitation.    4. Challenging images.    EKG:  NO EKG is ordered today.  The ekg independently reviewed from 02/28/20 demonstrated NSR 77 bp with first degree AV block (PR 214), occasional PVC and IVCD  Recent Labs: 02/28/2020: ALT 14; BUN 19; Creatinine, Ser 0.75; Hemoglobin 13.5; Platelets 141; Potassium 3.8; Sodium 140  Recent Lipid Panel No results found for: CHOL, TRIG, HDL, CHOLHDL, VLDL, LDLCALC, LDLDIRECT  Home Medications   Current Meds  Medication Sig  . acetaminophen (TYLENOL) 500 MG tablet Take 1,000 mg by mouth every 6 (six) hours as needed (for pain.).  Marland Kitchen aspirin EC 81 MG tablet Take 81 mg by mouth daily. Swallow whole.  . calcium carbonate (OSCAL) 1500 (600 Ca) MG TABS tablet Take 600 mg by mouth daily.  . Coenzyme Q10 (COQ10) 50 MG CAPS Take 50 mg by mouth at bedtime.  . fluticasone (FLONASE) 50 MCG/ACT nasal spray Place 1 spray into both nostrils daily.  Marland Kitchen lisinopril-hydrochlorothiazide (ZESTORETIC) 20-12.5 MG tablet Take 1 tablet by mouth daily.  . Multiple Vitamin (MULTIVITAMIN WITH MINERALS) TABS tablet Take 1 tablet by mouth daily.  . Multiple Vitamins-Minerals (MEGAVITE FRUITS & VEGGIES) TABS Take by mouth daily.  . simvastatin (ZOCOR) 20 MG tablet Take 20 mg by mouth at bedtime.    Review of Systems  All other systems reviewed and are otherwise negative except as noted above.  Physical Exam    VS:  BP 112/72 (BP Location: Left Arm, Patient Position: Sitting, Cuff Size: Normal)   Pulse 89   Ht 5\' 8"  (1.727 m)   Wt 180 lb (81.6 kg)   SpO2 98%   BMI 27.37 kg/m  , BMI Body mass index is 27.37 kg/m.  Wt Readings from Last 3 Encounters:  03/05/20 180 lb (81.6 kg)  02/28/20 181 lb 8 oz (82.3 kg)  02/28/20 183 lb 6 oz (83.2 kg)     GEN: Well nourished, well developed, in no acute distress. HEENT: normal. Neck: Supple, no JVD, carotid bruits, or masses. Cardiac: RRR, no murmurs, rubs, or gallops. No clubbing, cyanosis, edema.  Radials/DP/PT 2+ and equal bilaterally.   Respiratory:  Respirations regular and unlabored, clear to auscultation bilaterally. GI: Soft, nontender, nondistended. MS: No deformity or atrophy. Skin: Warm and dry, no rash. Neuro:  Strength and sensation are intact. Psych: Normal affect.  Assessment & Plan    1. Abnormal Lexiscan Myoview / Abnormal EKG -EKG 02/26/2020 with sinus rhythm with first-degree AV block, left axis deviation, nonspecific intraventricular conduction delay with occasional PVC.  Echo 02/29/2020 LVEF A999333, grade 1 diastolic dysfunction, mildly elevated PASP, mild to moderate MR.  Lexiscan Myoview with small defect of moderate severity in apical inferior location suggestive of prior small infarct with CT attenuation images showing mild coronary calcifications distally in RCA.  Intermediate risk study.  Need for further ischemic evaluation with cardiac catheterization.The risks [stroke (1 in 1000), death (1 in 40), kidney failure [usually temporary] (1 in 500), bleeding (1 in 200), allergic reaction [possibly serious] (1 in 200)], benefits (diagnostic support and management of coronary artery disease) and alternatives of a cardiac catheterization were discussed in detail with Mr. Freitag and he is willing to proceed. He will continue his Aspirin 81mg  daily.   2. ?HFrEF - Echo 02/29/20 with LVEF suggestive of LVEF 40-45% however noted to be mildly better based  on Dr. Darnelle Bos review. Lexiscan with small defect of moderate severity and estimated LVEF 45-54%. Plan for cardiac cath, as above which will allow for better assessment of LV function. Present GDMT includes Lisinopril. No symptoms of heart failure at this time - no orthopnea, PND, shortness of breath, edema.   3. Mild to moderate MR -by echo 02/29/2020.  Continue to monitor with periodic echo.  4. HTN- BP well controlled. Continue current antihypertensive regimen.   5. HLD, LDL goal <70 - Continue Simvastatin as prescribed by primary care. Will need to request most  recent lipid panel from PCP. Pending results of cardiac cath may need transition to higher potency statin.   6. Preoperative cardiovascular risk assessment - He will need to complete ischemic work-up with cardiac catheterization prior to his left shoulder arthroplasty.  He was made aware today in clinic and understanding.  Surgeon made aware as well.  Disposition: Cardiac cath scheduled 03/12/20. Follow up 1 week after cardiac catheterization with Dr. Saunders Revel or APP.   Signed, Loel Dubonnet, NP 03/05/2020, 6:22 PM [place]

## 2020-03-05 NOTE — Patient Instructions (Addendum)
Medication Instructions:  Your physician recommends that you continue on your current medications as directed. Please refer to the Current Medication list given to you today.  *If you need a refill on your cardiac medications before your next appointment, please call your pharmacy*   Lab Work:  COVID PRE- TEST: You will need a COVID TEST prior to the procedure:  LOCATION: [facility] Testing site.  DATE/TIME:  Monday 03/11/20 (anytime between 8 am and 1 pm) *Since this is the day before your                        procedure, please try to have test done as early as possible.      Testing/Procedures: [facility] Cardiac Cath Instructions   You are scheduled for a Cardiac Cath on: Tuesday March 12, 2020  Please arrive at 9:30 am on the day of your procedure  Please expect a call from our [facility] to pre-register you  Do not eat/drink anything after midnight  Someone will need to drive you home  It is recommended someone be with you for the first 24 hours after your procedure  Wear clothes that are easy to get on/off and wear slip on shoes if possible   Medications bring a current list of all medications with you  _X__ You may take all of your medications the morning of your procedure with enough water to swallow safely    Day of your procedure: Arrive at the [facility] entrance.  Free valet service is available.  After entering the [facility] please check-in at the registration desk (1st desk on your right) to receive your armband. After receiving your armband someone will escort you to the cardiac cath/special procedures waiting area.  The usual length of stay after your procedure is about 2 to 3 hours.  This can vary.  If you have any questions, please call our office at [phone], or you may call the cardiac cath lab at [facility] directly at [phone]    Follow-Up: At [facility], you and your health needs  are our priority.  As part of our continuing mission to provide you with exceptional heart care, we have created designated Provider Care Teams.  These Care Teams include your primary Cardiologist (physician) and Advanced Practice Providers (APPs -  Physician Assistants and Nurse Practitioners) who all work together to provide you with the care you need, when you need it.  We recommend signing up for the patient portal called "MyChart".  Sign up information is provided on this After Visit Summary.  MyChart is used to connect with patients for Virtual Visits (Telemedicine).  Patients are able to view lab/test results, encounter notes, upcoming appointments, etc.  Non-urgent messages can be sent to your provider as well.   To learn more about what you can do with MyChart, go to [email or web address].    Your next appointment:   1 week(s)  After cath  The format for your next appointment:   In Person  Provider:   You may see Dr. Saunders Revel or one of the following Advanced Practice Providers on your designated Care Team:    Murray Hodgkins, NP  Christell Faith, PA-C  Marrianne Mood, PA-C  Cadence Allardt, [place]  Laurann Montana, NP

## 2020-03-07 ENCOUNTER — Encounter: Admission: RE | Payer: Self-pay | Source: Home / Self Care

## 2020-03-07 ENCOUNTER — Inpatient Hospital Stay: Admission: RE | Admit: 2020-03-07 | Payer: Medicare HMO | Source: Home / Self Care | Admitting: Surgery

## 2020-03-07 SURGERY — ARTHROPLASTY, SHOULDER, TOTAL
Anesthesia: Choice | Site: Shoulder | Laterality: Left

## 2020-03-11 ENCOUNTER — Other Ambulatory Visit
Admission: RE | Admit: 2020-03-11 | Discharge: 2020-03-11 | Disposition: A | Discharge status: Home / Self Care | Payer: Medicare HMO | Source: Ambulatory Visit | Attending: Internal Medicine | Admitting: Internal Medicine

## 2020-03-11 ENCOUNTER — Other Ambulatory Visit: Payer: Self-pay

## 2020-03-11 DIAGNOSIS — Z01812 Encounter for preprocedural laboratory examination: Secondary | ICD-10-CM | POA: Insufficient documentation

## 2020-03-11 DIAGNOSIS — Z20822 Contact with and (suspected) exposure to covid-19: Secondary | ICD-10-CM | POA: Diagnosis not present

## 2020-03-12 ENCOUNTER — Encounter
Admission: RE | Disposition: A | Discharge status: Home / Self Care | Payer: Self-pay | Source: Home / Self Care | Attending: Internal Medicine

## 2020-03-12 ENCOUNTER — Encounter: Payer: Self-pay | Admitting: Internal Medicine

## 2020-03-12 ENCOUNTER — Ambulatory Visit
Admission: RE | Admit: 2020-03-12 | Discharge: 2020-03-12 | Disposition: A | Discharge status: Home / Self Care | Payer: Medicare HMO | Attending: Internal Medicine | Admitting: Internal Medicine

## 2020-03-12 DIAGNOSIS — R9439 Abnormal result of other cardiovascular function study: Secondary | ICD-10-CM | POA: Diagnosis present

## 2020-03-12 DIAGNOSIS — E785 Hyperlipidemia, unspecified: Secondary | ICD-10-CM | POA: Insufficient documentation

## 2020-03-12 DIAGNOSIS — I110 Hypertensive heart disease with heart failure: Secondary | ICD-10-CM | POA: Insufficient documentation

## 2020-03-12 DIAGNOSIS — R0609 Other forms of dyspnea: Secondary | ICD-10-CM | POA: Insufficient documentation

## 2020-03-12 DIAGNOSIS — I440 Atrioventricular block, first degree: Secondary | ICD-10-CM | POA: Diagnosis not present

## 2020-03-12 DIAGNOSIS — Z7982 Long term (current) use of aspirin: Secondary | ICD-10-CM | POA: Diagnosis not present

## 2020-03-12 DIAGNOSIS — I2584 Coronary atherosclerosis due to calcified coronary lesion: Secondary | ICD-10-CM

## 2020-03-12 DIAGNOSIS — I2510 Atherosclerotic heart disease of native coronary artery without angina pectoris: Secondary | ICD-10-CM | POA: Insufficient documentation

## 2020-03-12 DIAGNOSIS — Z880 Allergy status to penicillin: Secondary | ICD-10-CM | POA: Insufficient documentation

## 2020-03-12 DIAGNOSIS — I429 Cardiomyopathy, unspecified: Secondary | ICD-10-CM | POA: Diagnosis not present

## 2020-03-12 DIAGNOSIS — Z79899 Other long term (current) drug therapy: Secondary | ICD-10-CM | POA: Insufficient documentation

## 2020-03-12 DIAGNOSIS — I509 Heart failure, unspecified: Secondary | ICD-10-CM | POA: Diagnosis not present

## 2020-03-12 HISTORY — PX: LEFT HEART CATH AND CORONARY ANGIOGRAPHY: CATH118249

## 2020-03-12 LAB — SARS CORONAVIRUS 2 (TAT 6-24 HRS): SARS Coronavirus 2: NEGATIVE

## 2020-03-12 SURGERY — LEFT HEART CATH AND CORONARY ANGIOGRAPHY
Anesthesia: Moderate Sedation

## 2020-03-12 MED ORDER — SODIUM CHLORIDE 0.9% FLUSH: 3.0000 mL | Freq: Two times a day (BID) | INTRAVENOUS | Status: DC

## 2020-03-12 MED ORDER — SODIUM CHLORIDE 0.9 % WEIGHT BASED INFUSION
3.0000 mL/kg/h | INTRAVENOUS | Status: AC
  Administered 2020-03-12: 10:00:00 3 mL/kg/h via INTRAVENOUS

## 2020-03-12 MED ORDER — VERAPAMIL HCL 2.5 MG/ML IV SOLN
INTRAVENOUS | Status: AC
  Filled 2020-03-12: qty 2

## 2020-03-12 MED ORDER — HEPARIN SODIUM (PORCINE) 1000 UNIT/ML IJ SOLN
INTRAMUSCULAR | Status: AC
  Filled 2020-03-12: qty 1

## 2020-03-12 MED ORDER — HEPARIN SODIUM (PORCINE) 1000 UNIT/ML IJ SOLN
INTRAMUSCULAR | Status: DC | PRN
  Administered 2020-03-12: 4000 [IU] via INTRAVENOUS

## 2020-03-12 MED ORDER — SODIUM CHLORIDE 0.9 % IV SOLN: 250.0000 mL | INTRAVENOUS | Status: DC | PRN

## 2020-03-12 MED ORDER — VERAPAMIL HCL 2.5 MG/ML IV SOLN
INTRAVENOUS | Status: DC | PRN
  Administered 2020-03-12: 2.5 mg via INTRA_ARTERIAL

## 2020-03-12 MED ORDER — HEPARIN (PORCINE) IN NACL 1000-0.9 UT/500ML-% IV SOLN
INTRAVENOUS | Status: AC
  Filled 2020-03-12: qty 1000

## 2020-03-12 MED ORDER — SODIUM CHLORIDE 0.9 % WEIGHT BASED INFUSION: 1.0000 mL/kg/h | INTRAVENOUS | Status: DC

## 2020-03-12 MED ORDER — ASPIRIN 81 MG PO CHEW: 81.0000 mg | CHEWABLE_TABLET | ORAL | Status: DC

## 2020-03-12 MED ORDER — CARVEDILOL 3.125 MG PO TABS: 3.1250 mg | ORAL_TABLET | Freq: Two times a day (BID) | ORAL | 11 refills | Status: DC

## 2020-03-12 MED ORDER — FENTANYL CITRATE (PF) 100 MCG/2ML IJ SOLN
INTRAMUSCULAR | Status: AC
  Filled 2020-03-12: qty 2

## 2020-03-12 MED ORDER — ACETAMINOPHEN 325 MG PO TABS: 650.0000 mg | ORAL_TABLET | ORAL | Status: DC | PRN

## 2020-03-12 MED ORDER — IOHEXOL 300 MG/ML  SOLN
INTRAMUSCULAR | Status: DC | PRN
  Administered 2020-03-12: 11:00:00 40 mL

## 2020-03-12 MED ORDER — ONDANSETRON HCL 4 MG/2ML IJ SOLN: 4.0000 mg | Freq: Four times a day (QID) | INTRAMUSCULAR | Status: DC | PRN

## 2020-03-12 MED ORDER — HEPARIN (PORCINE) IN NACL 1000-0.9 UT/500ML-% IV SOLN
INTRAVENOUS | Status: DC | PRN
  Administered 2020-03-12: 500 mL

## 2020-03-12 MED ORDER — MIDAZOLAM HCL 2 MG/2ML IJ SOLN
INTRAMUSCULAR | Status: DC | PRN
  Administered 2020-03-12: 1 mg via INTRAVENOUS

## 2020-03-12 MED ORDER — FENTANYL CITRATE (PF) 100 MCG/2ML IJ SOLN
INTRAMUSCULAR | Status: DC | PRN
  Administered 2020-03-12: 25 ug via INTRAVENOUS

## 2020-03-12 MED ORDER — HYDRALAZINE HCL 20 MG/ML IJ SOLN
10.0000 mg | INTRAMUSCULAR | Status: DC | PRN
Start: 2020-03-12 — End: 2020-03-12

## 2020-03-12 MED ORDER — MIDAZOLAM HCL 2 MG/2ML IJ SOLN
INTRAMUSCULAR | Status: AC
  Filled 2020-03-12: qty 2

## 2020-03-12 MED ORDER — SODIUM CHLORIDE 0.9% FLUSH: 3.0000 mL | INTRAVENOUS | Status: DC | PRN

## 2020-03-12 MED ORDER — LABETALOL HCL 5 MG/ML IV SOLN
10.0000 mg | INTRAVENOUS | Status: DC | PRN
Start: 2020-03-12 — End: 2020-03-12

## 2020-03-12 MED ORDER — LIDOCAINE HCL (PF) 1 % IJ SOLN
INTRAMUSCULAR | Status: AC
  Filled 2020-03-12: qty 30

## 2020-03-12 MED ORDER — LIDOCAINE HCL (PF) 1 % IJ SOLN
INTRAMUSCULAR | Status: DC | PRN
  Administered 2020-03-12: 2 mL

## 2020-03-12 SURGICAL SUPPLY — 7 items
CATH 5F 110X4 TIG (CATHETERS) ×2 IMPLANT
DEVICE RAD TR BAND REGULAR (VASCULAR PRODUCTS) ×2 IMPLANT
GLIDESHEATH SLEND SS 6F .021 (SHEATH) ×2 IMPLANT
GUIDEWIRE INQWIRE 1.5J.035X260 (WIRE) ×1 IMPLANT
INQWIRE 1.5J .035X260CM (WIRE) ×2
KIT MANI 3VAL PERCEP (MISCELLANEOUS) ×2 IMPLANT
PACK CARDIAC CATH (CUSTOM PROCEDURE TRAY) ×2 IMPLANT

## 2020-03-12 NOTE — Interval H&P Note (Signed)
History and Physical Interval Note:  03/12/2020 10:08 AM  Larry Allen  has presented today for surgery, with the diagnosis of dyspnea on exertion, cardiomyopathy, and abnormal stress test.  The various methods of treatment have been discussed with the patient and family. After consideration of risks, benefits and other options for treatment, the patient has consented to  Procedure(s): LEFT HEART CATH AND CORONARY ANGIOGRAPHY (N/A) as a surgical intervention.  The patient's history has been reviewed, patient examined, no change in status, stable for surgery.  I have reviewed the patient's chart and labs.  Questions were answered to the patient's satisfaction.    Cath Lab Visit (complete for each Cath Lab visit)  Clinical Evaluation Leading to the Procedure:   ACS: No.  Non-ACS:    Anginal Classification: CCS II  Anti-ischemic medical therapy: No Therapy  Non-Invasive Test Results: Intermediate-risk stress test findings: cardiac mortality 1-3%/year  Prior CABG: No previous CABG  Benito Lemmerman

## 2020-03-12 NOTE — Discharge Instructions (Signed)
Radial Site Care  This sheet gives you information about how to care for yourself after your procedure. Your health care provider may also give you more specific instructions. If you have problems or questions, contact your health care provider. What can I expect after the procedure? After the procedure, it is common to have:  Bruising and tenderness at the catheter insertion area. Follow these instructions at home: Medicines  Take over-the-counter and prescription medicines only as told by your health care provider. Insertion site care  Follow instructions from your health care provider about how to take care of your insertion site. Make sure you: ? Wash your hands with soap and water before you change your bandage (dressing). If soap and water are not available, use hand sanitizer. ? Change your dressing as told by your health care provider. ? Leave stitches (sutures), skin glue, or adhesive strips in place. These skin closures may need to stay in place for 2 weeks or longer. If adhesive strip edges start to loosen and curl up, you may trim the loose edges. Do not remove adhesive strips completely unless your health care provider tells you to do that.  Check your insertion site every day for signs of infection. Check for: ? Redness, swelling, or pain. ? Fluid or blood. ? Pus or a bad smell. ? Warmth.  Do not take baths, swim, or use a hot tub until your health care provider approves.  You may shower 24-48 hours after the procedure, or as directed by your health care provider. ? Remove the dressing and gently wash the site with plain soap and water. ? Pat the area dry with a clean towel. ? Do not rub the site. That could cause bleeding.  Do not apply powder or lotion to the site. Activity   For 24 hours after the procedure, or as directed by your health care provider: ? Do not flex or bend the affected arm. ? Do not push or pull heavy objects with the affected arm. ? Do not  drive yourself home from the hospital or clinic. You may drive 24 hours after the procedure unless your health care provider tells you not to. ? Do not operate machinery or power tools.  Do not lift anything that is heavier than 10 lb (4.5 kg), or the limit that you are told, until your health care provider says that it is safe.  Ask your health care provider when it is okay to: ? Return to work or school. ? Resume usual physical activities or sports. ? Resume sexual activity. General instructions  If the catheter site starts to bleed, raise your arm and put firm pressure on the site. If the bleeding does not stop, get help right away. This is a medical emergency.  If you went home on the same day as your procedure, a responsible adult should be with you for the first 24 hours after you arrive home.  Keep all follow-up visits as told by your health care provider. This is important. Contact a health care provider if:  You have a fever.  You have redness, swelling, or yellow drainage around your insertion site. Get help right away if:  You have unusual pain at the radial site.  The catheter insertion area swells very fast.  The insertion area is bleeding, and the bleeding does not stop when you hold steady pressure on the area.  Your arm or hand becomes pale, cool, tingly, or numb. These symptoms may represent a serious problem   that is an emergency. Do not wait to see if the symptoms will go away. Get medical help right away. Call your local emergency services (911 in the [place] Do not drive yourself to the hospital. Summary  After the procedure, it is common to have bruising and tenderness at the site.  Follow instructions from your health care provider about how to take care of your radial site wound. Check the wound every day for signs of infection.  Do not lift anything that is heavier than 10 lb (4.5 kg), or the limit that you are told, until your health care provider says  that it is safe. This information is not intended to replace advice given to you by your health care provider. Make sure you discuss any questions you have with your health care provider. Document Revised: 04/07/2017 Document Reviewed: 04/07/2017 Elsevier Patient Education  2020 Elsevier Inc.  

## 2020-03-20 ENCOUNTER — Other Ambulatory Visit: Payer: Self-pay

## 2020-03-20 ENCOUNTER — Encounter: Payer: Self-pay | Admitting: Internal Medicine

## 2020-03-20 ENCOUNTER — Ambulatory Visit: Payer: Medicare HMO | Admitting: Internal Medicine

## 2020-03-20 VITALS — BP 110/70 | HR 49 | Ht 67.0 in | Wt 182.0 lb

## 2020-03-20 DIAGNOSIS — I10 Essential (primary) hypertension: Secondary | ICD-10-CM | POA: Diagnosis not present

## 2020-03-20 DIAGNOSIS — I2510 Atherosclerotic heart disease of native coronary artery without angina pectoris: Secondary | ICD-10-CM

## 2020-03-20 DIAGNOSIS — E785 Hyperlipidemia, unspecified: Secondary | ICD-10-CM | POA: Diagnosis not present

## 2020-03-20 DIAGNOSIS — Z01810 Encounter for preprocedural cardiovascular examination: Secondary | ICD-10-CM

## 2020-03-20 DIAGNOSIS — I493 Ventricular premature depolarization: Secondary | ICD-10-CM

## 2020-03-20 DIAGNOSIS — I428 Other cardiomyopathies: Secondary | ICD-10-CM

## 2020-03-20 DIAGNOSIS — D696 Thrombocytopenia, unspecified: Secondary | ICD-10-CM | POA: Diagnosis not present

## 2020-03-20 DIAGNOSIS — M8580 Other specified disorders of bone density and structure, unspecified site: Secondary | ICD-10-CM | POA: Diagnosis not present

## 2020-03-20 NOTE — Patient Instructions (Addendum)
Medication Instructions:  Your physician recommends that you continue on your current medications as directed. Please refer to the Current Medication list given to you today.  *If you need a refill on your cardiac medications before your next appointment, please call your pharmacy*  Follow-Up: At [facility], you and your health needs are our priority.  As part of our continuing mission to provide you with exceptional heart care, we have created designated Provider Care Teams.  These Care Teams include your primary Cardiologist (physician) and Advanced Practice Providers (APPs -  Physician Assistants and Nurse Practitioners) who all work together to provide you with the care you need, when you need it.  We recommend signing up for the patient portal called "MyChart".  Sign up information is provided on this After Visit Summary.  MyChart is used to connect with patients for Virtual Visits (Telemedicine).  Patients are able to view lab/test results, encounter notes, upcoming appointments, etc.  Non-urgent messages can be sent to your provider as well.   To learn more about what you can do with MyChart, go to [email or web address].    Your next appointment:   3 month(s)  The format for your next appointment:   In Person  Provider:   You may see Yvonne Kendall, MD or one of the following Advanced Practice Providers on your designated Care Team:    Gillian Shields, NP  Other Instructions Please discuss your cholesterol medication (statin) with your Primary Care Provider.

## 2020-03-20 NOTE — Progress Notes (Unsigned)
Follow-up Outpatient Visit Date: 03/20/2020  Primary Care Provider: Loyal Jacobson, MD [address] [place] [address]  Chief Complaint: Follow-up cardiac catheterization  HPI:  Larry Allen is a 80 y.o. male with history of hypertension, hyperlipidemia, and prostate cancer, who presents for follow-up of cardiomyopathy and abnormal stress test.  I have asked Larry Allen last month for preoperative cardiovascular risk assessment in anticipation of left shoulder surgery.  Abnormal EKG was noted in the preop clinic, prompting cardiology consultation.  Larry Allen was largely asymptomatic, though he noted 1 episode of significant exertional dyspnea while exercising in the weeks leading up to our visit.  Subsequent echo showed LVEF of 40-45%.  Myocardial perfusion stress test was intermediate risk with small apical inferior defect and mildly reduced LVEF.  Larry Allen underwent cardiac catheterization last week which showed moderate, nonobstructive CAD with a 50% proximal RCA stenosis.  Left ventricular filling pressures were mildly elevated.  Medical therapy was recommended.  Today, Larry Allen reports feeling well other than continued left shoulder pain.  He denies chest pain, shortness of breath, palpitations, lightheadedness, or edema.  His right radial arteriotomy site has healed well.  He had labs drawn earlier today as part of his regular physical with Dr. Leonette Most.  --------------------------------------------------------------------------------------------------  Cardiovascular History & Procedures: Cardiovascular Problems:  Moderate coronary artery disease  Nonischemic cardiomyopathy  Risk Factors:  Hypertension, hyperlipidemia, male gender, and age greater than 75  Cath/PCI:  LHC (03/12/2020): LMCA, LAD, ramus intermedius, and LCx normal.  Tortuous RCA with 50% proximal stenosis.  LVEDP 15-20 mmHg.  CV Surgery:  None  EP Procedures and  Devices:  None  Non-Invasive Evaluation(s):  Pharmacologic MPI (03/04/2020): Intermediate risk study with small fixed apical inferior defect suggestive of scar.  LVEF 45-54%.  Coronary artery calcification present.  TTE (02/29/2020): Normal LV size and wall thickness.  LVEF 40-45% with global hypokinesis and grade 1 diastolic dysfunction.  Normal RV size and function.  Mild pulmonary hypertension.  Mild to moderate mitral regurgitation.  Mild tricuspid regurgitation.  Mild aortic sclerosis without stenosis.   Recent CV Pertinent Labs: Lab Results  Component Value Date   K 3.8 02/28/2020   BUN 19 02/28/2020   CREATININE 0.75 02/28/2020    Past medical and surgical history were reviewed and updated in EPIC.  Current Meds  Medication Sig  . acetaminophen (TYLENOL) 500 MG tablet Take 1,000 mg by mouth every 6 (six) hours as needed (for pain.).  Marland Kitchen aspirin EC 81 MG tablet Take 81 mg by mouth daily. Swallow whole.  . calcium carbonate (OSCAL) 1500 (600 Ca) MG TABS tablet Take 600 mg by mouth daily.  . carvedilol (COREG) 3.125 MG tablet Take 1 tablet (3.125 mg total) by mouth 2 (two) times daily with a meal.  . Coenzyme Q10 (COQ10) 50 MG CAPS Take 50 mg by mouth at bedtime.  . fluticasone (FLONASE) 50 MCG/ACT nasal spray Place 1 spray into both nostrils daily.  Marland Kitchen lisinopril-hydrochlorothiazide (ZESTORETIC) 20-12.5 MG tablet Take 1 tablet by mouth daily.  . Multiple Vitamin (MULTIVITAMIN WITH MINERALS) TABS tablet Take 1 tablet by mouth daily.  . Multiple Vitamins-Minerals (MEGAVITE FRUITS & VEGGIES) TABS Take by mouth daily.  . sildenafil (REVATIO) 20 MG tablet Take 20 mg by mouth as needed.  . simvastatin (ZOCOR) 20 MG tablet Take 20 mg by mouth at bedtime.    Allergies: Amoxicillin  Social History   Tobacco Use  . Smoking status: Never Smoker  . Smokeless tobacco: Never  Used  Vaping Use  . Vaping Use: Never used  Substance Use Topics  . Alcohol use: Never  . Drug use: Never     Family History  Problem Relation Age of Onset  . Heart Problems Mother   . Heart Problems Sister   . Heart Problems Brother   . Heart Problems Brother     Review of Systems: A 12-system review of systems was performed and was negative except as noted in the HPI.  --------------------------------------------------------------------------------------------------  Physical Exam: BP 110/70 (BP Location: Left Arm, Patient Position: Sitting, Cuff Size: Normal)   Pulse (!) 49   Ht 5\' 7"  (1.702 m)   Wt 182 lb (82.6 kg)   SpO2 99%   BMI 28.51 kg/m   General:  NAD. Neck: No JVD or HJR. Lungs: Clear to auscultation bilaterally without wheezes or crackles. Heart: Regular rate and rhythm with occasional extrasystoles.  No murmurs, rubs, or gallops. Abdomen: Soft, nontender, nondistended. Extremities: No lower extremity edema.  EKG: Normal sinus rhythm with frequent PVCs, left axis deviation, and borderline LVH.  Lab Results  Component Value Date   WBC 7.5 02/28/2020   HGB 13.5 02/28/2020   HCT 40.0 02/28/2020   MCV 94.3 02/28/2020   PLT 141 (L) 02/28/2020    Lab Results  Component Value Date   NA 140 02/28/2020   K 3.8 02/28/2020   CL 107 02/28/2020   CO2 27 02/28/2020   BUN 19 02/28/2020   CREATININE 0.75 02/28/2020   GLUCOSE 99 02/28/2020   ALT 14 02/28/2020    No results found for: CHOL, HDL, LDLCALC, LDLDIRECT, TRIG, CHOLHDL  --------------------------------------------------------------------------------------------------  ASSESSMENT AND PLAN: Nonobstructive coronary artery disease: Recent catheterization showed 50% mid RCA stenosis but otherwise no significant CAD.  Given lipid panel earlier today showed LDL of 78, I think it would be reasonable to consider escalation to higher intensity statin therapy by switching to atorvastatin/rosuvastatin or increasing current dose of simvastatin.  Larry Allen wishes to discuss this with his PCP at his upcoming  appointment.  We will continue low-dose aspirin for now.  Nonischemic cardiomyopathy: Recent echo showed mildly reduced LVEF.  [name] is tolerating recently added carvedilol.  He is also chronically on lisinopril-HCTZ.  We will continue his current medication regimen.  If his LVEF does not improve on follow-up in a few months, we may need to consider ambulatory cardiac monitoring to assess his PVC burden.  Hypertension: Blood pressure well controlled today.  No medication changes.  Hyperlipidemia: In the setting of moderate single-vessel CAD, I think it would be appropriate to target an LDL less than 70.  I recommend transitioning to atorvastatin/rosuvastatin or potentially escalating simvastatin.  Mr. Mcclay wishes to discuss this with his PCP at their upcoming visit.  PVCs: Frequent PVCs again noted on EKG today.  We will continue low-dose carvedilol and consider ambulatory cardiac monitoring at follow-up if there has not been any improvement in LVEF or he develops symptoms.  Preoperative cardiac risk assessment: Mr. Slader has been asymptomatic with recent work-up showing nonobstructive CAD and mildly reduced LVEF.  I think it is reasonable for him to proceed with elective shoulder surgery, which is a low risk procedure, at his convenience.  No further cardiac testing or intervention is recommended at this time.  Follow-up: Return to clinic in 3 months.  Nelva Bush, MD 03/21/2020 2:52 PM

## 2020-03-21 ENCOUNTER — Encounter: Payer: Self-pay | Admitting: Internal Medicine

## 2020-03-21 DIAGNOSIS — J3089 Other allergic rhinitis: Secondary | ICD-10-CM | POA: Diagnosis not present

## 2020-03-21 DIAGNOSIS — R69 Illness, unspecified: Secondary | ICD-10-CM | POA: Diagnosis not present

## 2020-03-21 DIAGNOSIS — C61 Malignant neoplasm of prostate: Secondary | ICD-10-CM | POA: Diagnosis not present

## 2020-03-21 DIAGNOSIS — E785 Hyperlipidemia, unspecified: Secondary | ICD-10-CM | POA: Diagnosis not present

## 2020-03-21 DIAGNOSIS — I429 Cardiomyopathy, unspecified: Secondary | ICD-10-CM | POA: Diagnosis not present

## 2020-03-21 DIAGNOSIS — N529 Male erectile dysfunction, unspecified: Secondary | ICD-10-CM | POA: Diagnosis not present

## 2020-03-21 DIAGNOSIS — D696 Thrombocytopenia, unspecified: Secondary | ICD-10-CM | POA: Diagnosis not present

## 2020-03-21 DIAGNOSIS — I25118 Atherosclerotic heart disease of native coronary artery with other forms of angina pectoris: Secondary | ICD-10-CM | POA: Diagnosis not present

## 2020-03-21 DIAGNOSIS — I10 Essential (primary) hypertension: Secondary | ICD-10-CM | POA: Diagnosis not present

## 2020-03-21 DIAGNOSIS — Z0000 Encounter for general adult medical examination without abnormal findings: Secondary | ICD-10-CM | POA: Diagnosis not present

## 2020-03-21 DIAGNOSIS — K5909 Other constipation: Secondary | ICD-10-CM | POA: Diagnosis not present

## 2020-03-25 DIAGNOSIS — D696 Thrombocytopenia, unspecified: Secondary | ICD-10-CM | POA: Diagnosis not present

## 2020-03-28 DIAGNOSIS — M8589 Other specified disorders of bone density and structure, multiple sites: Secondary | ICD-10-CM | POA: Diagnosis not present

## 2020-03-28 DIAGNOSIS — M85852 Other specified disorders of bone density and structure, left thigh: Secondary | ICD-10-CM | POA: Diagnosis not present

## 2020-04-18 DIAGNOSIS — I10 Essential (primary) hypertension: Secondary | ICD-10-CM | POA: Diagnosis not present

## 2020-04-18 DIAGNOSIS — K635 Polyp of colon: Secondary | ICD-10-CM | POA: Diagnosis not present

## 2020-04-18 DIAGNOSIS — K641 Second degree hemorrhoids: Secondary | ICD-10-CM | POA: Diagnosis not present

## 2020-04-18 DIAGNOSIS — K648 Other hemorrhoids: Secondary | ICD-10-CM | POA: Diagnosis not present

## 2020-04-18 DIAGNOSIS — K6389 Other specified diseases of intestine: Secondary | ICD-10-CM | POA: Diagnosis not present

## 2020-04-18 DIAGNOSIS — Z1211 Encounter for screening for malignant neoplasm of colon: Secondary | ICD-10-CM | POA: Diagnosis not present

## 2020-04-18 DIAGNOSIS — I25118 Atherosclerotic heart disease of native coronary artery with other forms of angina pectoris: Secondary | ICD-10-CM | POA: Diagnosis not present

## 2020-04-18 DIAGNOSIS — D123 Benign neoplasm of transverse colon: Secondary | ICD-10-CM | POA: Diagnosis not present

## 2020-04-24 NOTE — Patient Instructions (Addendum)
Your procedure is scheduled on:04-30-20  TUESDAY Report to the Registration Desk on the 1st floor of the [facility]-Then proceed to the 2nd floor Surgery Desk in the [facility] To find out your arrival time, please call [phone] between 1PM - 3PM on:04-29-20 MONDAY  REMEMBER: Instructions that are not followed completely may result in serious medical risk, up to and including death; or upon the discretion of your surgeon and anesthesiologist your surgery may need to be rescheduled.  Do not eat food after midnight the night before surgery.  No gum chewing, lozengers or hard candies.  You may however, drink CLEAR liquids up to 2 hours before you are scheduled to arrive for your surgery. Do not drink anything within 2 hours of your scheduled arrival time.  Clear liquids include: - water  - apple juice without pulp - gatorade  - black coffee or tea (Do NOT add milk or creamers to the coffee or tea) Do NOT drink anything that is not on this list.  In addition, your doctor has ordered for you to drink the provided  Ensure Pre-Surgery Clear Carbohydrate Drink  Drinking this carbohydrate drink up to two hours before surgery helps to reduce insulin resistance and improve patient outcomes. Please complete drinking 2 hours prior to scheduled arrival time.  TAKE THESE MEDICATIONS THE MORNING OF SURGERY WITH A SIP OF WATER: -COREG (CARVEDILOL)  Follow recommendations from Cardiologist, Pulmonologist or PCP regarding stopping Aspirin, Coumadin, Plavix, Eliquis, Pradaxa, or Pletal-CALL DR POGGI' S OFFICE AND ASK WHEN YOU NEED TO STOP YOUR 81 MG ASPIRIN  One week prior to surgery: Stop Anti-inflammatories (NSAIDS) such as Advil, Aleve, Ibuprofen, Motrin, Naproxen, Naprosyn and Aspirin based products such as Excedrin, Goodys Powder, BC Powder-OK TO TAKE TYLENOL IF NEEDED  Stop ANY OVER THE COUNTER supplements until after surgery-STOP YOUR CO Q-10, BALANCE OF NATURE VEGGIES AND FRUIT-YOU MAY  RESUME AFTER SURGERY (However, you may continue taking Calcium, Vitamin D, Vitamin B12, Probiotic and Multivitamin up until the day before surgery.)  No Alcohol for 24 hours before or after surgery.  No Smoking including e-cigarettes for 24 hours prior to surgery.  No chewable tobacco products for at least 6 hours prior to surgery.  No nicotine patches on the day of surgery.  Do not use any "recreational" drugs for at least a week prior to your surgery.  Please be advised that the combination of cocaine and anesthesia may have negative outcomes, up to and including death. If you test positive for cocaine, your surgery will be cancelled.  On the morning of surgery brush your teeth with toothpaste and water, you may rinse your mouth with mouthwash if you wish. Do not swallow any toothpaste or mouthwash.  Do not wear jewelry, make-up, hairpins, clips or nail polish.  Do not wear lotions, powders, or perfumes.   Do not shave body from the neck down 48 hours prior to surgery just in case you cut yourself which could leave a site for infection.  Also, freshly shaved skin may become irritated if using the CHG soap.  Contact lenses, hearing aids and dentures may not be worn into surgery.  Do not bring valuables to the hospital. [facility] is not responsible for any missing/lost belongings or valuables.   Use CHG Soap as directed on instruction sheet.  Total Shoulder Arthroplasty:  use Benzolyl Peroxide 5% Gel as directed on instruction sheet.  Notify your doctor if there is any change in your medical condition (cold, fever, infection).  Wear comfortable clothing (specific to your surgery type) to the hospital.  Plan for stool softeners for home use; pain medications have a tendency to cause constipation. You can also help prevent constipation by eating foods high in fiber such as fruits and vegetables and drinking plenty of fluids as your diet allows.  After surgery, you can help  prevent lung complications by doing breathing exercises.  Take deep breaths and cough every 1-2 hours. Your doctor may order a device called an Incentive Spirometer to help you take deep breaths. When coughing or sneezing, hold a pillow firmly against your incision with both hands. This is called "splinting." Doing this helps protect your incision. It also decreases belly discomfort.  If you are being admitted to the hospital overnight, leave your suitcase in the car. After surgery it may be brought to your room.  If you are being discharged the day of surgery, you will not be allowed to drive home. You will need a responsible adult (18 years or older) to drive you home and stay with you that night.   If you are taking public transportation, you will need to have a responsible adult (18 years or older) with you. Please confirm with your physician that it is acceptable to use public transportation.   Please call the [facility] at [phone] if you have any questions about these instructions.  Visitation Policy:  Patients undergoing a surgery or procedure may have one family member or support person with them as long as that person is not COVID-19 positive or experiencing its symptoms.  That person may remain in the waiting area during the procedure.  Inpatient Visitation:    Visiting hours are 7 a.m. to 8 p.m. Patients will be allowed one visitor. The visitor may change daily. The visitor must pass COVID-19 screenings, use hand sanitizer when entering and exiting the patient's room and wear a mask at all times, including in the patient's room. Patients must also wear a mask when staff or their visitor are in the room. Masking is required regardless of vaccination status. Systemwide, no visitors 17 or younger.

## 2020-04-25 ENCOUNTER — Other Ambulatory Visit: Payer: Self-pay | Admitting: Surgery

## 2020-04-26 ENCOUNTER — Other Ambulatory Visit: Payer: Medicare HMO

## 2020-04-26 ENCOUNTER — Encounter
Admission: RE | Admit: 2020-04-26 | Discharge: 2020-04-26 | Disposition: A | Discharge status: Home / Self Care | Payer: Medicare HMO | Source: Ambulatory Visit | Attending: Surgery | Admitting: Surgery

## 2020-04-26 ENCOUNTER — Other Ambulatory Visit: Payer: Self-pay

## 2020-04-26 DIAGNOSIS — Z01812 Encounter for preprocedural laboratory examination: Secondary | ICD-10-CM | POA: Insufficient documentation

## 2020-04-26 DIAGNOSIS — M19012 Primary osteoarthritis, left shoulder: Secondary | ICD-10-CM | POA: Insufficient documentation

## 2020-04-26 DIAGNOSIS — Z20822 Contact with and (suspected) exposure to covid-19: Secondary | ICD-10-CM | POA: Diagnosis not present

## 2020-04-26 LAB — TYPE AND SCREEN
ABO/RH(D): A NEG
Antibody Screen: NEGATIVE

## 2020-04-26 LAB — SARS CORONAVIRUS 2 (TAT 6-24 HRS): SARS Coronavirus 2: NEGATIVE

## 2020-04-26 NOTE — Progress Notes (Signed)
Perioperative Services: Pre-Admission/Anesthesia Testing   Date: 04/26/20 Name: Larry Allen MRN:   [id number]  Re: Consideration of preoperative prophylactic antibiotic change   Request sent to: Poggi, Marshall Cork, MD (routed and/or faxed via [facility])  Planned Surgical Procedure(s):    Case: [id number] Date/Time: 04/30/20 0715   Procedure: TOTAL SHOULDER ARTHROPLASTY (Left Shoulder)   Anesthesia type: Choice   Pre-op diagnosis: PRIMARY OSTEOARTHRITIS OF LEFT SHOULDER.   Location: [facility] OR ROOM 03 / [place] [facility]S FOR ANESTHESIA GROUP   Surgeons: Corky Mull, MD    Notes: 1. Patient has a documented allergy to AMOXICILLIN only  Advising that amoxicillin has caused him to experience low severity rash and swelling in the past.   2. Receivedcephalosporin with no documented complications  CEFAZOLIN on 01/16/2019 and 03/03/2019  3. Screened as appropriate for cephalosporin use during medication reconciliation  No immediate angioedema, dysphagia, SOB, anaphylaxis symptoms.  No severe rash involving mucous membranes or skin necrosis.  No hospital admissions related to side effects of PCN/cephalosporin use.   No documented reaction to PCN or cephalosporin in the last 10 years.  Request:  As an evidence based approach to reducing the rate of incidence for post-operative SSI and the development of MDROs, could an agent with narrower coverage for preoperative prophylaxis in this patient's upcoming surgical course be considered?   1. Currently ordered preoperative prophylactic ABX: clindamycin.   2. Specifically requesting change to cephalosporin (CEFAZOLIN).   3. Please communicate decision with me and I will change the orders in Epic as per your direction.   Things to consider:  Many patients report that they were "allergic" to PCN earlier in life, however this does not translate into a true lifelong allergy. Patients can lose sensitivity to specific IgE antibodies over time if PCN is avoided  (Kleris & Lugar, 2019).   Up to 10% of the adult population and 15% of hospitalized patients report an allergy to PCN, however clinical studies suggest that 90% of those reporting an allergy can tolerate PCN antibiotics (Kleris & Lugar, 2019).   Cross-sensitivity between PCN and cephalosporins has been documented as being as high as 10%, however this estimation included data believed to have been collected in a setting where there was contamination. Newer data suggests that the prevalence of cross-sensitivity between PCN and cephalosporins is actually estimated to be closer to 1% (Hermanides et al., 2018).    Patients labeled as PCN allergic, whether they are truly allergic or not, have been found to have inferior outcomes in terms of rates of serious infection, and these patients tend to have longer hospital stays (Cartago, 2019).   Treatment related secondary infections, such as Clostridioides difficile, have been linked to the improper use of broad spectrum antibiotics in patients improperly labeled as PCN allergic (Kleris & Lugar, 2019).   Anaphylaxis from cephalosporins is rare and the evidence suggests that there is no increased risk of an anaphylactic type reaction when cephalosporins are used in a PCN allergic patient (Pichichero, 2006).  Citations: Hermanides J, Lemkes BA, Prins Pearla Dubonnet MW, Terreehorst I. Presumed ?-Lactam Allergy and Cross-reactivity in the Operating Theater: A Practical Approach. Anesthesiology. 2018 Aug;129(2):335-342. doi: 10.1097/ALN.0000000000002252. PMID: 53664403.  Kleris, [place]., & Lugar, P. L. (2019). Things We Do For No Reason: Failing to Question a Penicillin Allergy History. Journal of hospital medicine, 14(10), [phone]. Advance online publication. https://www.wallace-middleton.info/  Pichichero, M. E. (2006). Cephalosporins can be prescribed safely for penicillin-allergic patients. Journal of family medicine, 55(2), 106-112. Accessed:  https://cdn.mdedge.com/files/s39fs-public/Document/September-2017/5502JFP_AppliedEvidence1.pdf  Honor Loh, MSN, APRN, FNP-C, CEN [facility]  Peri-operative Services Nurse Practitioner FAX: [phone] 04/26/20 3:36 PM

## 2020-04-29 MED ORDER — FAMOTIDINE 20 MG PO TABS: 20.0000 mg | ORAL_TABLET | Freq: Once | ORAL | Status: DC

## 2020-04-29 MED ORDER — ORAL CARE MOUTH RINSE: 15.0000 mL | Freq: Once | OROMUCOSAL | Status: AC

## 2020-04-29 MED ORDER — CLINDAMYCIN PHOSPHATE 900 MG/50ML IV SOLN: 900.0000 mg | INTRAVENOUS | Status: DC

## 2020-04-29 MED ORDER — LACTATED RINGERS IV SOLN: INTRAVENOUS | Status: DC

## 2020-04-29 MED ORDER — CHLORHEXIDINE GLUCONATE 0.12 % MT SOLN: 15.0000 mL | Freq: Once | OROMUCOSAL | Status: AC

## 2020-04-29 NOTE — Progress Notes (Signed)
  Perioperative Services Pre-Admission/Anesthesia Testing     Date: 04/29/20  Name: Larry Allen MRN:   [id number]  Re: Plans for surgery   Case: [id number] Date/Time: 04/30/20 0715   Procedure: TOTAL SHOULDER ARTHROPLASTY (Left Shoulder)   Anesthesia type: Choice   Pre-op diagnosis: PRIMARY OSTEOARTHRITIS OF LEFT SHOULDER.   Location: [place] / [place] [facility] FOR ANESTHESIA GROUP   Surgeons: Corky Mull, MD    Patient scheduled for the above procedure on 04/30/2020 with Dr. Milagros Evener.  In preparation for his surgery, patient has undergone a full ischemic work-up with cardiology; see previous notes regarding TTE (LVEF 40-45%) and myocardial perfusion imaging study (small moderate severity apical inferior defect).  Patient underwent diagnostic left heart catheterization on 03/12/2020 that revealed moderate nonobstructive CAD with 50% stenosis of the proximal RCA.  There was no significant disease noted in the LAD or left circumflex.  Study revealed mildly elevated LVEDP of 15-20 mmHg.  Optimization through GDMT for management of patient's known nonischemic cardiomyopathy recommended.  Patient seen in follow-up consult on 03/20/2020 by cardiology (End, MD); notes reviewed at the time of his clinic visit, patient doing well from a cardiovascular perspective.  He denied any chest pain, shortness of breath, PND, orthopnea, palpitations, peripheral edema, vertiginous symptoms, or presyncope/syncope.  ECG repeated in the clinic revealed sinus bradycardia with PVCs at a rate of 49 bpm.  Blood pressure remained well controlled on prescribed beta-blocker, ACEi, and diuretic therapy.  Patient on a statin for his HLD.  Recent cardiovascular work-up discussed with patient.  MD noted that if LVEF does not improve when he follows up in the clinic, may need to consider ambulatory cardiac monitor to assess PVC burden.  No changes were made to patient's medication regimen.  In light of patient being  asymptomatic and recent ischemic work-up showing only nonobstructive CAD with mildly reduced LVEF, cardiology felt it "reasonable for patient to proceed with elective orthopedic procedure with no further cardiovascular testing or intervention".  Patient to follow-up with outpatient cardiology in 3 months or sooner if needed.  Larry Loh, MSN, APRN, FNP-C, CEN [facility]  Peri-operative Services Nurse Practitioner Phone: [phone] 04/29/20 8:33 AM

## 2020-04-30 ENCOUNTER — Ambulatory Visit: Payer: Medicare HMO | Admitting: Urgent Care

## 2020-04-30 ENCOUNTER — Ambulatory Visit: Payer: Medicare HMO

## 2020-04-30 ENCOUNTER — Encounter
Admission: RE | Disposition: A | Discharge status: Home / Self Care | Payer: Self-pay | Source: Home / Self Care | Attending: Surgery

## 2020-04-30 ENCOUNTER — Encounter: Payer: Self-pay | Admitting: Surgery

## 2020-04-30 ENCOUNTER — Other Ambulatory Visit: Payer: Self-pay

## 2020-04-30 ENCOUNTER — Ambulatory Visit
Admission: RE | Admit: 2020-04-30 | Discharge: 2020-04-30 | Disposition: A | Discharge status: Home / Self Care | Payer: Medicare HMO | Attending: Surgery | Admitting: Surgery

## 2020-04-30 DIAGNOSIS — Z7982 Long term (current) use of aspirin: Secondary | ICD-10-CM | POA: Insufficient documentation

## 2020-04-30 DIAGNOSIS — M19012 Primary osteoarthritis, left shoulder: Secondary | ICD-10-CM | POA: Diagnosis not present

## 2020-04-30 DIAGNOSIS — I429 Cardiomyopathy, unspecified: Secondary | ICD-10-CM | POA: Diagnosis not present

## 2020-04-30 DIAGNOSIS — M25512 Pain in left shoulder: Secondary | ICD-10-CM | POA: Diagnosis not present

## 2020-04-30 DIAGNOSIS — Z79899 Other long term (current) drug therapy: Secondary | ICD-10-CM | POA: Insufficient documentation

## 2020-04-30 DIAGNOSIS — Z471 Aftercare following joint replacement surgery: Secondary | ICD-10-CM | POA: Diagnosis not present

## 2020-04-30 DIAGNOSIS — Z96642 Presence of left artificial hip joint: Secondary | ICD-10-CM | POA: Diagnosis not present

## 2020-04-30 DIAGNOSIS — Z96612 Presence of left artificial shoulder joint: Secondary | ICD-10-CM | POA: Diagnosis not present

## 2020-04-30 DIAGNOSIS — G8918 Other acute postprocedural pain: Secondary | ICD-10-CM | POA: Diagnosis not present

## 2020-04-30 DIAGNOSIS — Z419 Encounter for procedure for purposes other than remedying health state, unspecified: Secondary | ICD-10-CM

## 2020-04-30 DIAGNOSIS — I10 Essential (primary) hypertension: Secondary | ICD-10-CM | POA: Diagnosis not present

## 2020-04-30 DIAGNOSIS — Z880 Allergy status to penicillin: Secondary | ICD-10-CM | POA: Insufficient documentation

## 2020-04-30 HISTORY — PX: TOTAL SHOULDER ARTHROPLASTY: SHX126

## 2020-04-30 SURGERY — ARTHROPLASTY, SHOULDER, TOTAL
Anesthesia: Regional | Site: Shoulder | Laterality: Left

## 2020-04-30 MED ORDER — ACETAMINOPHEN 500 MG PO TABS: 1000.0000 mg | ORAL_TABLET | Freq: Four times a day (QID) | ORAL | Status: DC

## 2020-04-30 MED ORDER — DROPERIDOL 2.5 MG/ML IJ SOLN
0.6250 mg | Freq: Once | INTRAMUSCULAR | Status: DC | PRN
  Filled 2020-04-30: qty 2

## 2020-04-30 MED ORDER — TRAMADOL HCL 50 MG PO TABS: 50.0000 mg | ORAL_TABLET | Freq: Four times a day (QID) | ORAL | Status: DC | PRN

## 2020-04-30 MED ORDER — FENTANYL CITRATE (PF) 100 MCG/2ML IJ SOLN
INTRAMUSCULAR | Status: AC
  Administered 2020-04-30: 50 ug via INTRAVENOUS
  Filled 2020-04-30: qty 2

## 2020-04-30 MED ORDER — ONDANSETRON HCL 4 MG PO TABS: 4.0000 mg | ORAL_TABLET | Freq: Four times a day (QID) | ORAL | Status: DC | PRN

## 2020-04-30 MED ORDER — MIDAZOLAM HCL 2 MG/2ML IJ SOLN
1.0000 mg | INTRAMUSCULAR | Status: DC | PRN
Start: 2020-04-30 — End: 2020-04-30

## 2020-04-30 MED ORDER — PROPOFOL 10 MG/ML IV BOLUS
INTRAVENOUS | Status: DC | PRN
  Administered 2020-04-30: 100 mg via INTRAVENOUS

## 2020-04-30 MED ORDER — METOCLOPRAMIDE HCL 10 MG PO TABS: 5.0000 mg | ORAL_TABLET | Freq: Three times a day (TID) | ORAL | Status: DC | PRN

## 2020-04-30 MED ORDER — BUPIVACAINE LIPOSOME 1.3 % IJ SUSP
INTRAMUSCULAR | Status: AC
  Filled 2020-04-30: qty 20

## 2020-04-30 MED ORDER — ONDANSETRON HCL 4 MG/2ML IJ SOLN
INTRAMUSCULAR | Status: AC
  Filled 2020-04-30: qty 2

## 2020-04-30 MED ORDER — OXYCODONE HCL 5 MG PO TABS: 5.0000 mg | ORAL_TABLET | Freq: Once | ORAL | Status: DC | PRN

## 2020-04-30 MED ORDER — KETOROLAC TROMETHAMINE 15 MG/ML IJ SOLN
INTRAMUSCULAR | Status: DC | PRN
  Administered 2020-04-30: 15 mg via INTRAVENOUS

## 2020-04-30 MED ORDER — FENTANYL CITRATE (PF) 100 MCG/2ML IJ SOLN
50.0000 ug | INTRAMUSCULAR | Status: AC | PRN
Start: 2020-04-30 — End: 2020-04-30
  Administered 2020-04-30: 25 ug via INTRAVENOUS
  Administered 2020-04-30: 50 ug via INTRAVENOUS

## 2020-04-30 MED ORDER — SUCCINYLCHOLINE CHLORIDE 200 MG/10ML IV SOSY
PREFILLED_SYRINGE | INTRAVENOUS | Status: AC
  Filled 2020-04-30: qty 10

## 2020-04-30 MED ORDER — PHENYLEPHRINE HCL (PRESSORS) 10 MG/ML IV SOLN
INTRAVENOUS | Status: DC | PRN
  Administered 2020-04-30: 200 ug via INTRAVENOUS

## 2020-04-30 MED ORDER — PHENYLEPHRINE HCL (PRESSORS) 10 MG/ML IV SOLN
INTRAVENOUS | Status: AC
  Filled 2020-04-30: qty 1

## 2020-04-30 MED ORDER — SODIUM CHLORIDE 0.9 % IV SOLN: INTRAVENOUS | Status: DC

## 2020-04-30 MED ORDER — ONDANSETRON HCL 4 MG/2ML IJ SOLN
INTRAMUSCULAR | Status: DC | PRN
  Administered 2020-04-30: 4 mg via INTRAVENOUS

## 2020-04-30 MED ORDER — BUPIVACAINE-EPINEPHRINE (PF) 0.5% -1:200000 IJ SOLN
INTRAMUSCULAR | Status: DC | PRN
  Administered 2020-04-30: 30 mL via PERINEURAL

## 2020-04-30 MED ORDER — ACETAMINOPHEN 10 MG/ML IV SOLN
INTRAVENOUS | Status: AC
  Filled 2020-04-30: qty 100

## 2020-04-30 MED ORDER — ACETAMINOPHEN 10 MG/ML IV SOLN
INTRAVENOUS | Status: DC | PRN
  Administered 2020-04-30: 1000 mg via INTRAVENOUS

## 2020-04-30 MED ORDER — TRANEXAMIC ACID 1000 MG/10ML IV SOLN
INTRAVENOUS | Status: AC
  Filled 2020-04-30: qty 10

## 2020-04-30 MED ORDER — CHLORHEXIDINE GLUCONATE 0.12 % MT SOLN
OROMUCOSAL | Status: AC
  Administered 2020-04-30: 15 mL via OROMUCOSAL
  Filled 2020-04-30: qty 15

## 2020-04-30 MED ORDER — GLYCOPYRROLATE 0.2 MG/ML IJ SOLN
INTRAMUSCULAR | Status: AC
  Filled 2020-04-30: qty 1

## 2020-04-30 MED ORDER — TRANEXAMIC ACID 1000 MG/10ML IV SOLN
INTRAVENOUS | Status: DC | PRN
  Administered 2020-04-30: 1000 mg via TOPICAL

## 2020-04-30 MED ORDER — EPINEPHRINE PF 1 MG/ML IJ SOLN
INTRAMUSCULAR | Status: AC
  Filled 2020-04-30: qty 1

## 2020-04-30 MED ORDER — SODIUM CHLORIDE 0.9 % IV SOLN
INTRAVENOUS | Status: DC | PRN
  Administered 2020-04-30: 100 mL via TOPICAL

## 2020-04-30 MED ORDER — DEXAMETHASONE SODIUM PHOSPHATE 10 MG/ML IJ SOLN
INTRAMUSCULAR | Status: AC
  Filled 2020-04-30: qty 1

## 2020-04-30 MED ORDER — ONDANSETRON HCL 4 MG/2ML IJ SOLN: 4.0000 mg | Freq: Four times a day (QID) | INTRAMUSCULAR | Status: DC | PRN

## 2020-04-30 MED ORDER — DEXAMETHASONE SODIUM PHOSPHATE 10 MG/ML IJ SOLN
INTRAMUSCULAR | Status: DC | PRN
  Administered 2020-04-30: 10 mg via INTRAVENOUS

## 2020-04-30 MED ORDER — OXYCODONE HCL 5 MG/5ML PO SOLN: 5.0000 mg | Freq: Once | ORAL | Status: DC | PRN

## 2020-04-30 MED ORDER — SUCCINYLCHOLINE CHLORIDE 20 MG/ML IJ SOLN
INTRAMUSCULAR | Status: DC | PRN
  Administered 2020-04-30: 160 mg via INTRAVENOUS

## 2020-04-30 MED ORDER — LORAZEPAM 2 MG/ML IJ SOLN: 1.0000 mg | Freq: Once | INTRAMUSCULAR | Status: DC | PRN

## 2020-04-30 MED ORDER — METOCLOPRAMIDE HCL 5 MG/ML IJ SOLN: 5.0000 mg | Freq: Three times a day (TID) | INTRAMUSCULAR | Status: DC | PRN

## 2020-04-30 MED ORDER — SODIUM CHLORIDE 0.9 % IV SOLN
INTRAVENOUS | Status: DC | PRN
  Administered 2020-04-30: 50 ug/min via INTRAVENOUS

## 2020-04-30 MED ORDER — TRAMADOL HCL 50 MG PO TABS: 50.0000 mg | ORAL_TABLET | Freq: Four times a day (QID) | ORAL | 0 refills | Status: DC | PRN

## 2020-04-30 MED ORDER — BUPIVACAINE HCL (PF) 0.5 % IJ SOLN
INTRAMUSCULAR | Status: AC
  Filled 2020-04-30: qty 10

## 2020-04-30 MED ORDER — VASOPRESSIN 20 UNIT/ML IV SOLN
INTRAVENOUS | Status: DC | PRN
  Administered 2020-04-30 (×3): 1 [IU] via INTRAVENOUS

## 2020-04-30 MED ORDER — MEPERIDINE HCL 50 MG/ML IJ SOLN: 6.2500 mg | INTRAMUSCULAR | Status: DC | PRN

## 2020-04-30 MED ORDER — MIDAZOLAM HCL 2 MG/2ML IJ SOLN
INTRAMUSCULAR | Status: AC
  Administered 2020-04-30: 1 mg via INTRAVENOUS
  Filled 2020-04-30: qty 2

## 2020-04-30 MED ORDER — FENTANYL CITRATE (PF) 100 MCG/2ML IJ SOLN
INTRAMUSCULAR | Status: AC
  Filled 2020-04-30: qty 2

## 2020-04-30 MED ORDER — PROMETHAZINE HCL 25 MG/ML IJ SOLN: 6.2500 mg | INTRAMUSCULAR | Status: DC | PRN

## 2020-04-30 MED ORDER — CEFAZOLIN SODIUM-DEXTROSE 2-4 GM/100ML-% IV SOLN
2.0000 g | Freq: Four times a day (QID) | INTRAVENOUS | Status: DC
  Administered 2020-04-30: 2 g via INTRAVENOUS

## 2020-04-30 MED ORDER — OXYCODONE HCL 5 MG PO TABS: 5.0000 mg | ORAL_TABLET | ORAL | Status: DC | PRN

## 2020-04-30 MED ORDER — GLYCOPYRROLATE 0.2 MG/ML IJ SOLN
INTRAMUSCULAR | Status: DC | PRN
  Administered 2020-04-30: .2 mg via INTRAVENOUS

## 2020-04-30 MED ORDER — SUGAMMADEX SODIUM 500 MG/5ML IV SOLN
INTRAVENOUS | Status: DC | PRN
  Administered 2020-04-30: 200 mg via INTRAVENOUS

## 2020-04-30 MED ORDER — OXYCODONE HCL 5 MG PO TABS: 5.0000 mg | ORAL_TABLET | Freq: Four times a day (QID) | ORAL | 0 refills | Status: DC | PRN

## 2020-04-30 MED ORDER — EPHEDRINE SULFATE 50 MG/ML IJ SOLN
INTRAMUSCULAR | Status: DC | PRN
  Administered 2020-04-30: 10 mg via INTRAVENOUS

## 2020-04-30 MED ORDER — CEFAZOLIN SODIUM-DEXTROSE 2-4 GM/100ML-% IV SOLN
2.0000 g | Freq: Once | INTRAVENOUS | Status: AC
  Administered 2020-04-30: 2 g via INTRAVENOUS

## 2020-04-30 MED ORDER — HYDROMORPHONE HCL 1 MG/ML IJ SOLN: 0.2500 mg | INTRAMUSCULAR | Status: DC | PRN

## 2020-04-30 MED ORDER — ROCURONIUM BROMIDE 10 MG/ML (PF) SYRINGE
PREFILLED_SYRINGE | INTRAVENOUS | Status: AC
  Filled 2020-04-30: qty 10

## 2020-04-30 MED ORDER — ROCURONIUM BROMIDE 100 MG/10ML IV SOLN
INTRAVENOUS | Status: DC | PRN
  Administered 2020-04-30: 10 mg via INTRAVENOUS
  Administered 2020-04-30: 20 mg via INTRAVENOUS
  Administered 2020-04-30: 30 mg via INTRAVENOUS

## 2020-04-30 MED ORDER — CEFAZOLIN SODIUM-DEXTROSE 2-4 GM/100ML-% IV SOLN
INTRAVENOUS | Status: AC
  Filled 2020-04-30: qty 100

## 2020-04-30 MED ORDER — BUPIVACAINE HCL (PF) 0.5 % IJ SOLN
INTRAMUSCULAR | Status: AC
  Filled 2020-04-30: qty 30

## 2020-04-30 MED ORDER — PROPOFOL 10 MG/ML IV BOLUS
INTRAVENOUS | Status: AC
  Filled 2020-04-30: qty 20

## 2020-04-30 MED ORDER — LIDOCAINE HCL (PF) 1 % IJ SOLN
INTRAMUSCULAR | Status: AC
  Filled 2020-04-30: qty 5

## 2020-04-30 SURGICAL SUPPLY — 79 items
ADPR HD STD TPR HUM TI RVRS (Orthopedic Implant) ×1 IMPLANT
APL PRP STRL LF DISP 70% ISPRP (MISCELLANEOUS) ×2
BAG DECANTER FOR FLEXI CONT (MISCELLANEOUS) IMPLANT
BLADE SAGITTAL WIDE XTHICK NO (BLADE) ×2 IMPLANT
BOWL CEMENT MIX W/ADAPTER (MISCELLANEOUS) ×2 IMPLANT
CANISTER SUCT 1200ML W/VALVE (MISCELLANEOUS) IMPLANT
CANISTER SUCT 3000ML PPV (MISCELLANEOUS) IMPLANT
CEMENT BONE R 1X40 (Cement) ×2 IMPLANT
CHLORAPREP W/TINT 26 (MISCELLANEOUS) ×4 IMPLANT
COMPONENT SHLDR NAN STMLSS SYS (Shoulder) ×1 IMPLANT
COOLER POLAR GLACIER W/PUMP (MISCELLANEOUS) ×2 IMPLANT
COVER BACK TABLE REUSABLE LG (DRAPES) ×2 IMPLANT
COVER WAND RF STERILE (DRAPES) IMPLANT
DRAPE 3/4 80X56 (DRAPES) ×4 IMPLANT
DRAPE IMP U-DRAPE 54X76 (DRAPES) ×4 IMPLANT
DRAPE INCISE IOBAN 66X45 STRL (DRAPES) ×4 IMPLANT
DRSG OPSITE POSTOP 4X8 (GAUZE/BANDAGES/DRESSINGS) ×2 IMPLANT
ELECT BLADE 6.5 EXT (BLADE) ×2 IMPLANT
ELECT CAUTERY BLADE 6.4 (BLADE) ×2 IMPLANT
ELECT REM PT RETURN 9FT ADLT (ELECTROSURGICAL) ×2
ELECTRODE REM PT RTRN 9FT ADLT (ELECTROSURGICAL) ×1 IMPLANT
GAUZE PACK 2X3YD (PACKING) ×2 IMPLANT
GAUZE XEROFORM 1X8 LF (GAUZE/BANDAGES/DRESSINGS) ×2 IMPLANT
GLENOID PEGGED STRL MED 4MM (Orthopedic Implant) ×2 IMPLANT
GLENOID POST REGENREX HYBRID (Orthopedic Implant) ×2 IMPLANT
GLOVE INDICATOR 8.0 STRL GRN (GLOVE) ×2 IMPLANT
GLOVE SRG 8 PF TXTR STRL LF DI (GLOVE) ×1 IMPLANT
GLOVE SURG ENC MOIS LTX SZ7.5 (GLOVE) ×8 IMPLANT
GLOVE SURG ENC MOIS LTX SZ8 (GLOVE) ×8 IMPLANT
GLOVE SURG UNDER POLY LF SZ8 (GLOVE) ×2
GOWN STRL REUS W/ TWL LRG LVL3 (GOWN DISPOSABLE) ×1 IMPLANT
GOWN STRL REUS W/ TWL XL LVL3 (GOWN DISPOSABLE) ×1 IMPLANT
GOWN STRL REUS W/TWL LRG LVL3 (GOWN DISPOSABLE) ×2
GOWN STRL REUS W/TWL XL LVL3 (GOWN DISPOSABLE) ×2
HEAD HUMERAL BIPOLAR 52X24X50 (Miscellaneous) ×1 IMPLANT
HEAD HUMERAL COMP STD (Orthopedic Implant) ×1 IMPLANT
HOOD PEEL AWAY FLYTE STAYCOOL (MISCELLANEOUS) ×6 IMPLANT
HUMERAL HEAD BIPOLAR 52X24X50 (Miscellaneous) ×2 IMPLANT
HUMERAL HEAD COMP STD (Orthopedic Implant) ×2 IMPLANT
ILLUMINATOR WAVEGUIDE N/F (MISCELLANEOUS) IMPLANT
IV NS 100ML SINGLE PACK (IV SOLUTION) ×2 IMPLANT
KIT STABILIZATION SHOULDER (MISCELLANEOUS) ×2 IMPLANT
MANIFOLD NEPTUNE II (INSTRUMENTS) ×2 IMPLANT
MASK FACE SPIDER DISP (MASK) ×2 IMPLANT
MAT ABSORB  FLUID 56X50 GRAY (MISCELLANEOUS) ×1
MAT ABSORB FLUID 56X50 GRAY (MISCELLANEOUS) ×1 IMPLANT
NDL SAFETY ECLIPSE 18X1.5 (NEEDLE) ×1 IMPLANT
NEEDLE HYPO 18GX1.5 SHARP (NEEDLE) ×2
NEEDLE SPNL 20GX3.5 QUINCKE YW (NEEDLE) ×2 IMPLANT
NS IRRIG 500ML POUR BTL (IV SOLUTION) ×2 IMPLANT
PACK ARTHROSCOPY SHOULDER (MISCELLANEOUS) ×2 IMPLANT
PAD WRAPON POLAR SHDR UNIV (MISCELLANEOUS) ×1 IMPLANT
PENCIL SMOKE EVACUATOR (MISCELLANEOUS) ×2 IMPLANT
PIN HUMERAL STMN 3.2MMX9IN (INSTRUMENTS) ×2 IMPLANT
PIN STEINMANN NAMO 7 (PIN) ×2 IMPLANT
PULSAVAC PLUS IRRIG FAN TIP (DISPOSABLE) ×2
SHOULDER NANO STEMLESS SYS (Shoulder) ×2 IMPLANT
SLING ULTRA II M (MISCELLANEOUS) IMPLANT
SOL .9 NS 3000ML IRR  AL (IV SOLUTION) ×1
SOL .9 NS 3000ML IRR AL (IV SOLUTION) ×1
SOL .9 NS 3000ML IRR UROMATIC (IV SOLUTION) ×1 IMPLANT
SPONGE LAP 18X18 RF (DISPOSABLE) ×2 IMPLANT
STAPLER SKIN PROX 35W (STAPLE) ×2 IMPLANT
STRAP SAFETY 5IN WIDE (MISCELLANEOUS) ×2 IMPLANT
SUT ETHIBOND 0 MO6 C/R (SUTURE) ×2 IMPLANT
SUT FIBERWIRE #2 38 BLUE 1/2 (SUTURE) ×4
SUT FIBERWIRE #2 38 T-5 BLUE (SUTURE) ×10
SUT VIC AB 0 CT1 36 (SUTURE) ×4 IMPLANT
SUT VIC AB 2-0 CT1 27 (SUTURE) ×4
SUT VIC AB 2-0 CT1 TAPERPNT 27 (SUTURE) ×2 IMPLANT
SUTURE FIBERWR #2 38 BLUE 1/2 (SUTURE) ×2 IMPLANT
SUTURE FIBERWR #2 38 T-5 BLUE (SUTURE) ×5 IMPLANT
SYR 10ML LL (SYRINGE) ×2 IMPLANT
SYR 30ML LL (SYRINGE) ×4 IMPLANT
SYR TOOMEY IRRIG 70ML (MISCELLANEOUS) ×2
SYRINGE TOOMEY IRRIG 70ML (MISCELLANEOUS) ×1 IMPLANT
TAPE TRANSPORE STRL 2 31045 (GAUZE/BANDAGES/DRESSINGS) ×2 IMPLANT
TIP FAN IRRIG PULSAVAC PLUS (DISPOSABLE) ×1 IMPLANT
WRAPON POLAR PAD SHDR UNIV (MISCELLANEOUS) ×2

## 2020-04-30 NOTE — Anesthesia Procedure Notes (Addendum)
Procedure Name: Intubation Date/Time: 04/30/2020 7:48 AM Performed by: Allean Found, CRNA Pre-anesthesia Checklist: Patient identified, Patient being monitored, Timeout performed, Emergency Drugs available and Suction available Patient Re-evaluated:Patient Re-evaluated prior to induction Oxygen Delivery Method: Circle system utilized Preoxygenation: Pre-oxygenation with 100% oxygen Induction Type: IV induction Ventilation: Mask ventilation without difficulty Laryngoscope Size: Mac and 4 Grade View: Grade I Tube type: Oral Tube size: 7.5 mm Number of attempts: 1 Airway Equipment and Method: Stylet Placement Confirmation: ETT inserted through vocal cords under direct vision,  positive ETCO2 and breath sounds checked- equal and bilateral Secured at: 23 cm Tube secured with: Tape Dental Injury: Teeth and Oropharynx as per pre-operative assessment

## 2020-04-30 NOTE — Op Note (Signed)
04/30/2020  10:24 AM  Patient:   Larry Allen  Pre-Op Diagnosis:   Severe degenerative joint disease, left shoulder.  Post-Op Diagnosis:   Same.  Procedure:   Left total shoulder arthroplasty.  Surgeon:   Pascal Lux, MD  Assistant:   Cameron Proud, PA-C; Jaynie Bream, PA-S  Anesthesia:   General endotracheal intubation with an interscalene block using Exparel.  Findings:   As above. The rotator cuff was in satisfactory condition.  Complications:   None  EBL:   50 cc  Fluids:   1100 cc crystalloid  UOP:   None  TT:   None  Drains:   None  Closure:   Staples  Implants:   Biomet Comprehensive system with a 38 mm humeral Nanostem, a 24 x 52 mm humeral head, and a medium cemented glenoid component with a Regenerex post.  Brief Clinical Note:   The patient is a 80 year old male with a long history of progressively worsening left shoulder pain. His symptoms have progressed despite medications, activity modification, etc. His history and examination are consistent with advanced degenerative joint disease, confirmed by plain radiographs. Preoperative MRI scan demonstrated that his rotator cuff was in satisfactory condition. The patient presents at this time for a left total shoulder arthroplasty.  Procedure:   The patient underwent placement of an interscalene block using Exparel in the preoperative holding area by the anesthesiologist before being brought into the operating room and lain in the supine position. After satisfactory general endotracheal intubation and anesthesia was achieved, the patient was repositioned in the beach chair position using the beach chair positioner. The left shoulder and upper extremity were prepped with ChloraPrep solution before being draped sterilely. Preoperative antibiotics were administered.   A standard anterior approach to the shoulder was made through an approximately 4-5 inch incision. The incision was carried down through the  subcutaneous tissues to expose the deltopectoral fascia. The interval between the deltoid and pectoralis muscles was identified and this plane developed, retracting the cephalic vein laterally with the deltoid muscle. The conjoined tendon was identified. The lateral margin was dissected and the Kolbel self-retraining retractor inserted. The "three sisters" were identified and cauterized. Bursal tissues were removed to improve visualization.  The biceps tendon was identified in the bicipital groove, and a soft tissue biceps tenodesis performed using two #0 Ethibond interrupted sutures, tacking the distal portion of the biceps tendon to the adjacent pectoralis major tendon tissues.  The subscapularis tendon was released from its attachment to the lesser tuberosity 1 cm proximal to its insertion and several tagging sutures placed. The inferior capsule was released with care after identifying and protecting the axillary nerve.   The humeral head was dislocated and, using the appropriate guide, the humeral head cut was made with an oscillating saw. This piece was taken to the back table where it was found to be optimally replicated by a 24 x 52 mm humeral head.  Attention was directed to the glenoid. The labrum was debrided circumferentially before the center of the glenoid was marked with electrocautery. The medium and large sizers were positioned and it was elected to proceed with a medium glenoid component. The guidewire was drilled into the glenoid neck using the appropriate guide. After verifying its position, the glenoid was lightly reamed with the butterfly reamer before the centralizing Regenerex post reamer was used. The medium peripheral peg guide was positioned and each of the three pegs drilled sequentially, leaving the preceding drill bit in place so as to minimize  shifting of the guide. The bony surfaces were prepared for cementing by irrigating them thoroughly with bacitracin saline solution using the  jet lavage system, then packing the glenoid with a Neo-Synephrine soaked sponge. Meanwhile, cement was mixed on the back table. When it was ready, some cement was injected into each of the three peg holes using a Toomey syringe and additional cement applied to the posterior aspect of the glenoid component. The component was impacted into place and the excess cement was removed using a Surveyor, quantity. Pressure was maintained on the glenoid until the cement hardened.  Attention was re-directed to the humeral side. The proximal humeral surface was sized to ensure central position of the guidepin. This guidepin was left in place and the proximal humerus prepared using the appropriate reamer and punch before the medium sized Sidus humeral anchor was impacted into place. Several trial reductions were performed using multiple head sizes before settling on the 24 x 52 mm trial humeral head. After reducing the shoulder, the arm demonstrated excellent range of motion as the hand could be brought across the chest to the opposite shoulder and brought to the top of the patient's head and to the patient's ear. The shoulder remained stable throughout this range of motion, and was stable with abduction and external rotation. The joint was dislocated and the trial component was removed. The permanent 24 x 52 mm humeral head was impacted into place. Again, the [facility] taper locking mechanism was verified using manual distraction. The shoulder was relocated and again placed through a range of motion with the findings as described above.  The wound was copiously irrigated with sterile saline solution using the jet lavage system before a total of 30 cc of 0.5% Sensorcaine with epinephrine was injected into the pericapsular and peri-incisional tissues to help with postoperative analgesia. The subscapularis tendon was reapproximated using #2 FiberWire interrupted sutures. The deltopectoral interval was closed using #0 Vicryl  interrupted sutures before the subcutaneous tissues were closed using 2-0 Vicryl interrupted sutures. The skin was closed using staples. Prior to closing the skin, 1 g of transexemic acid in 10 cc of normal saline was injected intra-articularly to help with postoperative bleeding. A sterile occlusive dressing was applied to the wound before the arm was placed into a shoulder immobilizer with an abduction pillow. A polar care device also was applied to the shoulder. The patient was then transferred back to a hospital bed before being awakened, extubated, and returned to the recovery room in satisfactory condition after tolerating the procedure well.

## 2020-04-30 NOTE — H&P (Addendum)
History of Present Illness: Larry Allen is a 80 y.o. who presents today for history and physical for left total shoulder arthroplasty with Dr. Roland Rack on 04/30/2020. Patient states that his shoulders have been bothering him over the past 2 years, he denies any injury or trauma affecting his shoulders. He has increased discomfort when reaching above his head or out to the side, his left shoulder hurts more than his right shoulder. He is right-hand dominant, he states that he is still able to play golf with minimal discomfort. He reports an aching, grinding sensation in bilateral shoulders that does extend into bilateral elbows but denies any pain past the elbow. He denies any numbness or tingling to bilateral upper extremities. He has taken Tylenol as needed for discomfort at this time. The patient has been evaluated in the past by other orthopedic providers and has undergone bilateral shoulder steroid injections with little relief. Patient last underwent a left subacromial steroid injection in August of this year and underwent a right steroid injection in March of this year. He states that the right shoulder injection did provide moderate relief however his last 2 injections for the left shoulder did not provide any significant benefit.  Past Medical History: . Astigmatism with presbyopia, bilateral  . Hyperlipidemia  . Hypertension  . Nuclear sclerotic cataract of both eyes  . Prostate cancer (CMS-HCC) 2016  . Thrombocytopenia (CMS-HCC)   Past Surgical History: . APPENDECTOMY  . INGUINAL HERNIA REPAIR as a child  . INGUINAL HERNIA REPAIR ROBOTIC ASSISTED Left 03/03/2019 Gayland Curry, MD)  . Left Total Knee Arthroplasty Left 01/16/2019 Louann Liv, MD)  . PROSTATE BIOPSY 10/23/2014   Past Family History: . Diabetes Mother  . Heart disease Mother  . Colon cancer Mother  . High blood pressure (Hypertension) Mother  . Cataracts Father  . Heart disease Sister  . Heart failure  Brother  . Heart disease Brother  . Cataracts Maternal Grandmother  . Cataracts Maternal Grandfather  . Cataracts Paternal Grandmother  . Cataracts Paternal Grandfather   Medications: . carvediloL (COREG) 3.125 MG tablet Take 1 tablet by mouth 2 (two) times daily  . rosuvastatin (CRESTOR) 20 MG tablet Take 20 mg by mouth once daily  . acetaminophen (TYLENOL) 500 MG tablet Take 1,000 mg by mouth as needed  . aspirin 81 MG EC tablet Take by mouth Take 81 mg by mouth daily.  . calcium carbonate 600 mg calcium (1,500 mg) Tab tablet Take by mouth  . co-enzyme Q-10, ubiquinone, 100 mg capsule Take by mouth Take 100 mg by mouth daily.  Marland Kitchen docusate (COLACE) 100 MG capsule Take by mouth Take 100 mg by mouth daily.  . fluticasone propionate (FLONASE) 50 mcg/actuation nasal spray Place 2 sprays into both nostrils once daily  . lisinopriL-hydrochlorothiazide (ZESTORETIC) 20-12.5 mg tablet Take 1 tablet by mouth once daily  . loratadine (CLARITIN) 10 mg tablet Take 1 tablet by mouth once daily  . multivitamin tablet Take 1 tablet by mouth once daily  . polyethylene glycol (MIRALAX) powder Take by mouth Take 17 g by mouth daily.  . sildenafil (REVATIO) 20 mg tablet Take 1 tablet by mouth nightly as needed  . simvastatin (ZOCOR) 20 MG tablet Take 1 tablet by mouth nightly  . zolpidem (AMBIEN) 5 MG tablet Take 1 tablet by mouth nightly as needed   Allergies: . Amoxicillin - Rash   Review of Systems: A comprehensive 14 point ROS was performed, reviewed, and the pertinent orthopaedic findings are documented in the  HPI.  Physical Exam: BP 138/82  Ht 172.7 cm (5\' 8" )  Wt 83.4 kg (183 lb 12.8 oz)  BMI 27.95 kg/m   General: Well-developed well-nourished male seen in no acute distress.   HEENT: Atraumatic,normocephalic. Pupils are equal and reactive to light. Oropharynx is clear with moist mucosa  Lungs: Clear to auscultation bilaterally   Cardiovascular: Regular rate and rhythm. However the  patient was noted to drop a beat occasionally. Normal S1, S2. No murmurs. No appreciable gallops or rubs. Peripheral pulses are palpable.  Abdomen: Soft, non-tender, nondistended. Bowel sounds present  Left Upper Extremity: Examination of the left shoulder and arm showed no bony abnormality or edema. While the patient is able to achieve full forward flexion and abduction bilaterally, he begins to experience pain when reaching above 90 degrees in both planes with severe crepitus. With the left arm abducted 90 degrees he can tolerate external rotation 70 degrees, internal rotation 60 degrees of moderate crepitus. Negative drop arm test bilaterally. Generalized tenderness to palpation to bilateral shoulders at today's visit. There is a negative sulcus sign. The rotator cuff muscle strength is 5/5 with supraspinatus, 4/5 with internal rotation, and 4/5 with external rotation. There is no crepitus with range of motion activities.   Neurological: The patient is alert and oriented Sensation to light touch appears to be intact and within normal limits Gross motor strength appeared to be equal to 5/5  Vascular : Peripheral pulses felt to be palpable. Capillary refill appears to be intact and within normal limits  X-ray: MRI OF THE LEFT SHOULDER WITHOUT CONTRAST:  1. Markedly severe glenohumeral osteoarthritis.  2. Linear chondrolabral defect along the posterosuperior labrum  associated with a 1.3 by 1.2 by 1.3 cm paralabral cyst which extends  medially and which may be eroding the adjacent posterosuperior  glenoid. Additional small free osteochondral fragments in the biceps  recess.  3. Synovitis within the glenohumeral joint.  4. Mild supraspinatus and subscapularis tendinopathy.  5. Expansion and increased signal in the intra-articular segment of  the long head of the biceps favoring partial tearing or prominent  tendinopathy.  6. Mild degenerative AC joint arthropathy.    Impression: Degenerative arthrosis left shoulder.  Plan:  The treatment options, including both surgical and nonsurgical choices, have been discussed in detail with the patient. The patient would like to proceed with a left total shoulder arthroplasty. The risks (including bleeding, infection, nerve and/or blood vessel injury, persistent or recurrent pain, loosening or failure of the components, leg length inequality, dislocation, need for further surgery, blood clots, strokes, heart attacks or arrhythmias, pneumonia, etc.) and benefits of the surgical procedure were discussed. The patient states his understanding and agrees to proceed. A formal written consent will be obtained by the nursing staff.   H&P reviewed and patient re-examined. No changes.

## 2020-04-30 NOTE — Discharge Instructions (Addendum)
Orthopedic discharge instructions: May shower with intact OpSite dressing. Apply ice frequently to shoulder or use Polar Care device. Take ibuprofen 600-800 mg TID with meals for 5-7 days, then as necessary. Take pain medication as prescribed when needed.  May supplement with ES Tylenol if necessary. Keep shoulder immobilizer on at all times except may remove for bathing purposes. Follow-up in 10-14 days or as scheduled.  AMBULATORY SURGERY  DISCHARGE INSTRUCTIONS   1) The drugs that you were given will stay in your system until tomorrow so for the next 24 hours you should not:  A) Drive an automobile B) Make any legal decisions C) Drink any alcoholic beverage   2) You may resume regular meals tomorrow.  Today it is better to start with liquids and gradually work up to solid foods.  You may eat anything you prefer, but it is better to start with liquids, then soup and crackers, and gradually work up to solid foods.   3) Please notify your doctor immediately if you have any unusual bleeding, trouble breathing, redness and pain at the surgery site, drainage, fever, or pain not relieved by medication.    4) Additional Instructions:  Leave green band on for 4 days  To indicate    Please contact your physician with any problems or Same Day Surgery at [phone], Monday through Friday 6 am to 4 pm, or [place] at [facility] number at [phone].

## 2020-04-30 NOTE — OR Nursing (Signed)
Can move fingers well but no pain or sensation in left arm.

## 2020-04-30 NOTE — Anesthesia Preprocedure Evaluation (Signed)
Anesthesia Evaluation  Patient identified by MRN, date of birth, ID band Patient awake    Reviewed: Allergy & Precautions, H&P , NPO status , Patient's Chart, lab work & pertinent test results  Airway Mallampati: II       Dental  (+) Partial Lower, Upper Dentures   Pulmonary neg pulmonary ROS,    Pulmonary exam normal breath sounds clear to auscultation       Cardiovascular hypertension, + DOE  Normal cardiovascular exam Rhythm:Regular Rate:Normal + Systolic murmurs    Neuro/Psych negative neurological ROS  negative psych ROS   GI/Hepatic negative GI ROS, Neg liver ROS,   Endo/Other  negative endocrine ROS  Renal/GU negative Renal ROS  negative genitourinary   Musculoskeletal negative musculoskeletal ROS (+)   Abdominal   Peds negative pediatric ROS (+)  Hematology negative hematology ROS (+)   Anesthesia Other Findings Past Medical History: 2016: Cancer ([place])     Comment:  prostate cancer  No date: HLD (hyperlipidemia) No date: Hypertension No date: Prostate cancer ([place]) No date: Thrombocytopenia (HCC)   Reproductive/Obstetrics negative OB ROS                             Anesthesia Physical Anesthesia Plan  ASA: III  Anesthesia Plan: General and Regional   Post-op Pain Management:  Regional for Post-op pain   Induction: Intravenous  PONV Risk Score and Plan: 2 and Ondansetron and Dexamethasone  Airway Management Planned: Oral ETT  Additional Equipment:   Intra-op Plan:   Post-operative Plan: Extubation in OR  Informed Consent: I have reviewed the patients History and Physical, chart, labs and discussed the procedure including the risks, benefits and alternatives for the proposed anesthesia with the patient or authorized representative who has indicated his/her understanding and acceptance.     Dental advisory given  Plan Discussed with: CRNA, Anesthesiologist and  Surgeon  Anesthesia Plan Comments:         Anesthesia Quick Evaluation

## 2020-04-30 NOTE — Evaluation (Signed)
Occupational Therapy Evaluation Patient Details Name: Larry Allen MRN: [id number] DOB: [birth year] Today's Date: 04/30/2020    History of Present Illness The patient is a 80 year old male with a long history of progressively worsening left shoulder pain. His symptoms have progressed despite medications, activity modification, etc. POD #0 from L TSA   Clinical Impression   Larry Allen was seen for an OT evaluation this date. Pt lives with wife in single story home c 2 STE. Prior to surgery, pt was active and independent. Pt has orders for LUE to be immobilized and will be NWBing per MD. Patient presents with impaired strength/ROM, pain, and sensation to LUE with block not completely resolved yet. These impairments result in a decreased ability to perform self care tasks requiring MAX A for UB/LB dressing. MAX A for application of polar care and sling/immobilizer. Pt and spouse instructed in polar care mgt, sling/immobilizer mgt, ROM exercises for LUE (with instructions for no shoulder exercises until full sensation has returned), functional application of LUE precautions, adaptive strategies for bathing/dressing/toileting/grooming, positioning for sleep, and home/routines modifications to maximize falls prevention. Handout provided.   Sling/polar care/gown doffed and shirt/sling/polar care donned sitting EOC. Spouse requesting to trial stairs to simulate 2 STE at home - pt completed c CGA + R HHA. Pt verbalized understanding of all education/training provided. Pt will benefit from skilled OT services to address these limitations and improve independence in daily tasks. Recommend following surgeons recommendations for follow up therapies to continue therapy to maximize return to PLOF, minimize falls risk, and minimize caregiver burden.       Follow Up Recommendations  Follow surgeon's recommendation for DC plan and follow-up therapies    Equipment Recommendations  None recommended by OT     Recommendations for Other Services       Precautions / Restrictions Precautions Precautions: Shoulder Shoulder Interventions: Shoulder sling/immobilizer;Shoulder abduction pillow;Off for dressing/bathing/exercises Precaution Booklet Issued: Yes (comment) Required Braces or Orthoses: Sling Restrictions Weight Bearing Restrictions: Yes LUE Weight Bearing: Non weight bearing      Mobility Bed Mobility               General bed mobility comments: received and left up in chair    Transfers Overall transfer level: Needs assistance Equipment used: 1 person hand held assist Transfers: Sit to/from Stand Sit to Stand: Min guard              Balance Overall balance assessment: Mild deficits observed, not formally tested                                         ADL either performed or assessed with clinical judgement   ADL Overall ADL's : Needs assistance/impaired                                       General ADL Comments: MAX A don/doff L polar care/shoulder immobilizer seated EOC. CGA for ADL t/f, stairs (2 steps), and in room mobility.                  Pertinent Vitals/Pain Pain Assessment: No/denies pain     Hand Dominance Right   Extremity/Trunk Assessment Upper Extremity Assessment Upper Extremity Assessment: LUE deficits/detail LUE: Unable to fully assess due to immobilization   Lower Extremity Assessment Lower Extremity Assessment:  Overall WFL for tasks assessed       Communication Communication Communication: No difficulties   Cognition Arousal/Alertness: Awake/alert Behavior During Therapy: WFL for tasks assessed/performed Overall Cognitive Status: Within Functional Limits for tasks assessed                                     General Comments       Exercises Exercises: Other exercises Other Exercises Other Exercises: Pt and spouse educated re: OT role, DME recs, d/c recs, falls  prevention, ECS, polar care mgmt, sling mgmt, ROM exercises, home/routines modifications Other Exercises: Don/doff polar care, shirt, sling; sit<>stand x3, sitting/standing balance/tolerance, stairs        Home Living Family/patient expects to be discharged to:: Private residence Living Arrangements: Spouse/significant other Available Help at Discharge: Family;Available 24 hours/day Type of Home: House Home Access: Stairs to enter [employer] of Steps: 2 Entrance Stairs-Rails: None Home Layout: One level     Bathroom Shower/Tub: Walk-in shower         Home Equipment: Kasandra Knudsen - single point;Shower seat;Grab bars - tub/shower          Prior Functioning/Environment Level of Independence: Independent                 OT Problem List: Decreased range of motion;Decreased activity tolerance;Decreased safety awareness;Impaired UE functional use         OT Goals(Current goals can be found in the care plan section) Acute Rehab OT Goals Patient Stated Goal: To return to PLOF OT Goal Formulation: With patient/family Time For Goal Achievement: 05/14/20 Potential to Achieve Goals: Good   AM-PAC OT "6 Clicks" Daily Activity     Outcome Measure Help from another person eating meals?: A Little Help from another person taking care of personal grooming?: A Little Help from another person toileting, which includes using toliet, bedpan, or urinal?: A Little Help from another person bathing (including washing, rinsing, drying)?: A Lot Help from another person to put on and taking off regular upper body clothing?: A Lot Help from another person to put on and taking off regular lower body clothing?: A Lot 6 Click Score: 15   End of Session Nurse Communication: Mobility status  Activity Tolerance: Patient tolerated treatment well Patient left: in chair;with call bell/phone within reach;with nursing/sitter in room;with family/visitor present  OT Visit Diagnosis: Other  abnormalities of gait and mobility (R26.89)                Time: 6222-9798 OT Time Calculation (min): 40 min Charges:  OT General Charges $OT Visit: 1 Visit OT Evaluation $OT Eval Low Complexity: 1 Low OT Treatments $Self Care/Home Management : 38-52 mins  Dessie Coma, M.S. OTR/L  04/30/20, 3:09 PM  ascom [phone]

## 2020-04-30 NOTE — Transfer of Care (Cosign Needed)
Immediate Anesthesia Transfer of Care Note  Patient: Darrik Richman  Procedure(s) Performed: TOTAL SHOULDER ARTHROPLASTY (Left Shoulder)  Patient Location: PACU  Anesthesia Type:General and Regional  Level of Consciousness: awake, alert  and oriented  Airway & Oxygen Therapy: Patient connected to face mask  Post-op Assessment: Post -op Vital signs reviewed and stable  Post vital signs: stable  Last Vitals:  Vitals Value Taken Time  BP 117/54 04/30/20 1036  Temp 36.1 C 04/30/20 1036  Pulse 70 04/30/20 1043  Resp 18 04/30/20 1043  SpO2 97 % 04/30/20 1043  Vitals shown include unvalidated device data.  Last Pain:  Vitals:   04/30/20 1036  TempSrc:   PainSc: Asleep         Complications: No complications documented.

## 2020-04-30 NOTE — Anesthesia Procedure Notes (Signed)
Anesthesia Regional Block: Interscalene brachial plexus block   Pre-Anesthetic Checklist: ,, timeout performed, Correct Patient, Correct Site, Correct Laterality, Correct Procedure, Correct Position, site marked, Risks and benefits discussed,  Surgical consent,  Pre-op evaluation,  At surgeon's request and post-op pain management  Laterality: Left  Prep: chloraprep       Needles:  Injection technique: Single-shot  Needle Type: Stimiplex     Needle Length: 5cm  Needle Gauge: 22     Additional Needles:   Procedures:,,,, ultrasound used (permanent image in chart),,,,  Narrative:  Start time: 04/30/2020 7:25 AM End time: 04/30/2020 7:26 AM Injection made incrementally with aspirations every 5 mL.  Performed by: Personally   Additional Notes: Functioning IV was confirmed and monitors were applied.  A 23mm 22ga Stimuplex needle was used. Sterile prep and drape,hand hygiene and sterile gloves were used.  Negative aspiration and negative test dose prior to incremental administration of local anesthetic. The patient tolerated the procedure well.  TO 5217 Local: 0.5% Bupivacaine 27ml, Exparel 59ml

## 2020-04-30 NOTE — Anesthesia Postprocedure Evaluation (Signed)
Anesthesia Post Note  Patient: Larry Allen  Procedure(s) Performed: TOTAL SHOULDER ARTHROPLASTY (Left Shoulder)  Patient location during evaluation: PACU Anesthesia Type: Regional and General Level of consciousness: awake Pain management: pain level controlled Vital Signs Assessment: post-procedure vital signs reviewed and stable Respiratory status: spontaneous breathing Cardiovascular status: blood pressure returned to baseline and stable Postop Assessment: no apparent nausea or vomiting Anesthetic complications: no   No complications documented.   Last Vitals:  Vitals:   04/30/20 1130 04/30/20 1146  BP: (!) 114/51 (!) 119/50  Pulse: 70 71  Resp: 18 16  Temp: 36.4 C 36.5 C  SpO2: 95% 98%    Last Pain:  Vitals:   04/30/20 1146  TempSrc: Temporal  PainSc: 0-No pain                 Neva Seat

## 2020-05-01 ENCOUNTER — Encounter: Payer: Self-pay | Admitting: Surgery

## 2020-05-02 DIAGNOSIS — Z8546 Personal history of malignant neoplasm of prostate: Secondary | ICD-10-CM | POA: Diagnosis not present

## 2020-05-02 DIAGNOSIS — Z79899 Other long term (current) drug therapy: Secondary | ICD-10-CM | POA: Diagnosis not present

## 2020-05-02 DIAGNOSIS — R079 Chest pain, unspecified: Secondary | ICD-10-CM | POA: Diagnosis not present

## 2020-05-02 DIAGNOSIS — R7989 Other specified abnormal findings of blood chemistry: Secondary | ICD-10-CM | POA: Diagnosis not present

## 2020-05-02 DIAGNOSIS — Z881 Allergy status to other antibiotic agents status: Secondary | ICD-10-CM | POA: Diagnosis not present

## 2020-05-02 DIAGNOSIS — I10 Essential (primary) hypertension: Secondary | ICD-10-CM | POA: Diagnosis not present

## 2020-05-02 DIAGNOSIS — M25512 Pain in left shoulder: Secondary | ICD-10-CM | POA: Diagnosis not present

## 2020-05-02 DIAGNOSIS — R222 Localized swelling, mass and lump, trunk: Secondary | ICD-10-CM | POA: Diagnosis not present

## 2020-05-02 DIAGNOSIS — R0989 Other specified symptoms and signs involving the circulatory and respiratory systems: Secondary | ICD-10-CM | POA: Diagnosis not present

## 2020-05-02 DIAGNOSIS — R918 Other nonspecific abnormal finding of lung field: Secondary | ICD-10-CM | POA: Diagnosis not present

## 2020-05-02 DIAGNOSIS — R0789 Other chest pain: Secondary | ICD-10-CM | POA: Diagnosis not present

## 2020-05-02 DIAGNOSIS — M25511 Pain in right shoulder: Secondary | ICD-10-CM | POA: Diagnosis not present

## 2020-05-02 LAB — SURGICAL PATHOLOGY

## 2020-05-04 DIAGNOSIS — D696 Thrombocytopenia, unspecified: Secondary | ICD-10-CM | POA: Diagnosis not present

## 2020-05-04 DIAGNOSIS — Z96612 Presence of left artificial shoulder joint: Secondary | ICD-10-CM | POA: Diagnosis not present

## 2020-05-04 DIAGNOSIS — Z9181 History of falling: Secondary | ICD-10-CM | POA: Diagnosis not present

## 2020-05-04 DIAGNOSIS — Z471 Aftercare following joint replacement surgery: Secondary | ICD-10-CM | POA: Diagnosis not present

## 2020-05-04 DIAGNOSIS — Z8546 Personal history of malignant neoplasm of prostate: Secondary | ICD-10-CM | POA: Diagnosis not present

## 2020-05-04 DIAGNOSIS — I10 Essential (primary) hypertension: Secondary | ICD-10-CM | POA: Diagnosis not present

## 2020-05-04 DIAGNOSIS — E785 Hyperlipidemia, unspecified: Secondary | ICD-10-CM | POA: Diagnosis not present

## 2020-05-04 DIAGNOSIS — H9192 Unspecified hearing loss, left ear: Secondary | ICD-10-CM | POA: Diagnosis not present

## 2020-05-06 DIAGNOSIS — Z471 Aftercare following joint replacement surgery: Secondary | ICD-10-CM | POA: Diagnosis not present

## 2020-05-07 DIAGNOSIS — H9192 Unspecified hearing loss, left ear: Secondary | ICD-10-CM | POA: Diagnosis not present

## 2020-05-07 DIAGNOSIS — Z96612 Presence of left artificial shoulder joint: Secondary | ICD-10-CM | POA: Diagnosis not present

## 2020-05-07 DIAGNOSIS — I10 Essential (primary) hypertension: Secondary | ICD-10-CM | POA: Diagnosis not present

## 2020-05-07 DIAGNOSIS — Z471 Aftercare following joint replacement surgery: Secondary | ICD-10-CM | POA: Diagnosis not present

## 2020-05-07 DIAGNOSIS — Z9181 History of falling: Secondary | ICD-10-CM | POA: Diagnosis not present

## 2020-05-07 DIAGNOSIS — E785 Hyperlipidemia, unspecified: Secondary | ICD-10-CM | POA: Diagnosis not present

## 2020-05-07 DIAGNOSIS — Z8546 Personal history of malignant neoplasm of prostate: Secondary | ICD-10-CM | POA: Diagnosis not present

## 2020-05-07 DIAGNOSIS — D696 Thrombocytopenia, unspecified: Secondary | ICD-10-CM | POA: Diagnosis not present

## 2020-05-09 DIAGNOSIS — E785 Hyperlipidemia, unspecified: Secondary | ICD-10-CM | POA: Diagnosis not present

## 2020-05-09 DIAGNOSIS — I10 Essential (primary) hypertension: Secondary | ICD-10-CM | POA: Diagnosis not present

## 2020-05-09 DIAGNOSIS — Z471 Aftercare following joint replacement surgery: Secondary | ICD-10-CM | POA: Diagnosis not present

## 2020-05-09 DIAGNOSIS — H9192 Unspecified hearing loss, left ear: Secondary | ICD-10-CM | POA: Diagnosis not present

## 2020-05-09 DIAGNOSIS — Z96612 Presence of left artificial shoulder joint: Secondary | ICD-10-CM | POA: Diagnosis not present

## 2020-05-09 DIAGNOSIS — D696 Thrombocytopenia, unspecified: Secondary | ICD-10-CM | POA: Diagnosis not present

## 2020-05-09 DIAGNOSIS — Z9181 History of falling: Secondary | ICD-10-CM | POA: Diagnosis not present

## 2020-05-09 DIAGNOSIS — Z8546 Personal history of malignant neoplasm of prostate: Secondary | ICD-10-CM | POA: Diagnosis not present

## 2020-05-10 DIAGNOSIS — Z471 Aftercare following joint replacement surgery: Secondary | ICD-10-CM | POA: Diagnosis not present

## 2020-05-10 DIAGNOSIS — E785 Hyperlipidemia, unspecified: Secondary | ICD-10-CM | POA: Diagnosis not present

## 2020-05-10 DIAGNOSIS — H9192 Unspecified hearing loss, left ear: Secondary | ICD-10-CM | POA: Diagnosis not present

## 2020-05-10 DIAGNOSIS — D696 Thrombocytopenia, unspecified: Secondary | ICD-10-CM | POA: Diagnosis not present

## 2020-05-10 DIAGNOSIS — I10 Essential (primary) hypertension: Secondary | ICD-10-CM | POA: Diagnosis not present

## 2020-05-10 DIAGNOSIS — Z9181 History of falling: Secondary | ICD-10-CM | POA: Diagnosis not present

## 2020-05-10 DIAGNOSIS — Z8546 Personal history of malignant neoplasm of prostate: Secondary | ICD-10-CM | POA: Diagnosis not present

## 2020-05-10 DIAGNOSIS — Z96612 Presence of left artificial shoulder joint: Secondary | ICD-10-CM | POA: Diagnosis not present

## 2020-05-13 DIAGNOSIS — Z9181 History of falling: Secondary | ICD-10-CM | POA: Diagnosis not present

## 2020-05-13 DIAGNOSIS — H2513 Age-related nuclear cataract, bilateral: Secondary | ICD-10-CM | POA: Diagnosis not present

## 2020-05-13 DIAGNOSIS — H5212 Myopia, left eye: Secondary | ICD-10-CM | POA: Diagnosis not present

## 2020-05-13 DIAGNOSIS — E871 Hypo-osmolality and hyponatremia: Secondary | ICD-10-CM | POA: Diagnosis not present

## 2020-05-13 DIAGNOSIS — I10 Essential (primary) hypertension: Secondary | ICD-10-CM | POA: Diagnosis not present

## 2020-05-13 DIAGNOSIS — R0789 Other chest pain: Secondary | ICD-10-CM | POA: Diagnosis not present

## 2020-05-13 DIAGNOSIS — H52203 Unspecified astigmatism, bilateral: Secondary | ICD-10-CM | POA: Diagnosis not present

## 2020-05-13 DIAGNOSIS — Z8546 Personal history of malignant neoplasm of prostate: Secondary | ICD-10-CM | POA: Diagnosis not present

## 2020-05-13 DIAGNOSIS — H524 Presbyopia: Secondary | ICD-10-CM | POA: Diagnosis not present

## 2020-05-13 DIAGNOSIS — H25013 Cortical age-related cataract, bilateral: Secondary | ICD-10-CM | POA: Diagnosis not present

## 2020-05-13 DIAGNOSIS — H5201 Hypermetropia, right eye: Secondary | ICD-10-CM | POA: Diagnosis not present

## 2020-05-13 DIAGNOSIS — D696 Thrombocytopenia, unspecified: Secondary | ICD-10-CM | POA: Diagnosis not present

## 2020-05-13 DIAGNOSIS — E785 Hyperlipidemia, unspecified: Secondary | ICD-10-CM | POA: Diagnosis not present

## 2020-05-13 DIAGNOSIS — H25043 Posterior subcapsular polar age-related cataract, bilateral: Secondary | ICD-10-CM | POA: Diagnosis not present

## 2020-05-13 DIAGNOSIS — Z471 Aftercare following joint replacement surgery: Secondary | ICD-10-CM | POA: Diagnosis not present

## 2020-05-13 DIAGNOSIS — Z96612 Presence of left artificial shoulder joint: Secondary | ICD-10-CM | POA: Diagnosis not present

## 2020-05-13 DIAGNOSIS — H9192 Unspecified hearing loss, left ear: Secondary | ICD-10-CM | POA: Diagnosis not present

## 2020-05-15 DIAGNOSIS — M25512 Pain in left shoulder: Secondary | ICD-10-CM | POA: Diagnosis not present

## 2020-05-15 DIAGNOSIS — M19012 Primary osteoarthritis, left shoulder: Secondary | ICD-10-CM | POA: Diagnosis not present

## 2020-05-17 DIAGNOSIS — M19012 Primary osteoarthritis, left shoulder: Secondary | ICD-10-CM | POA: Diagnosis not present

## 2020-05-17 DIAGNOSIS — M25512 Pain in left shoulder: Secondary | ICD-10-CM | POA: Diagnosis not present

## 2020-05-22 DIAGNOSIS — M19012 Primary osteoarthritis, left shoulder: Secondary | ICD-10-CM | POA: Diagnosis not present

## 2020-05-22 DIAGNOSIS — M25512 Pain in left shoulder: Secondary | ICD-10-CM | POA: Diagnosis not present

## 2020-05-24 DIAGNOSIS — M25512 Pain in left shoulder: Secondary | ICD-10-CM | POA: Diagnosis not present

## 2020-05-24 DIAGNOSIS — M19012 Primary osteoarthritis, left shoulder: Secondary | ICD-10-CM | POA: Diagnosis not present

## 2020-05-29 DIAGNOSIS — M19012 Primary osteoarthritis, left shoulder: Secondary | ICD-10-CM | POA: Diagnosis not present

## 2020-05-29 DIAGNOSIS — M25512 Pain in left shoulder: Secondary | ICD-10-CM | POA: Diagnosis not present

## 2020-05-31 DIAGNOSIS — M19012 Primary osteoarthritis, left shoulder: Secondary | ICD-10-CM | POA: Diagnosis not present

## 2020-05-31 DIAGNOSIS — M25512 Pain in left shoulder: Secondary | ICD-10-CM | POA: Diagnosis not present

## 2020-06-05 DIAGNOSIS — M25512 Pain in left shoulder: Secondary | ICD-10-CM | POA: Diagnosis not present

## 2020-06-05 DIAGNOSIS — M19012 Primary osteoarthritis, left shoulder: Secondary | ICD-10-CM | POA: Diagnosis not present

## 2020-06-07 DIAGNOSIS — M19012 Primary osteoarthritis, left shoulder: Secondary | ICD-10-CM | POA: Diagnosis not present

## 2020-06-07 DIAGNOSIS — M25512 Pain in left shoulder: Secondary | ICD-10-CM | POA: Diagnosis not present

## 2020-06-07 DIAGNOSIS — C61 Malignant neoplasm of prostate: Secondary | ICD-10-CM | POA: Diagnosis not present

## 2020-06-12 DIAGNOSIS — M25512 Pain in left shoulder: Secondary | ICD-10-CM | POA: Diagnosis not present

## 2020-06-12 DIAGNOSIS — C61 Malignant neoplasm of prostate: Secondary | ICD-10-CM | POA: Diagnosis not present

## 2020-06-12 DIAGNOSIS — M19012 Primary osteoarthritis, left shoulder: Secondary | ICD-10-CM | POA: Diagnosis not present

## 2020-06-14 DIAGNOSIS — M19012 Primary osteoarthritis, left shoulder: Secondary | ICD-10-CM | POA: Diagnosis not present

## 2020-06-14 DIAGNOSIS — M6281 Muscle weakness (generalized): Secondary | ICD-10-CM | POA: Diagnosis not present

## 2020-06-14 DIAGNOSIS — M25512 Pain in left shoulder: Secondary | ICD-10-CM | POA: Diagnosis not present

## 2020-06-17 DIAGNOSIS — Z96612 Presence of left artificial shoulder joint: Secondary | ICD-10-CM | POA: Diagnosis not present

## 2020-06-19 DIAGNOSIS — M6281 Muscle weakness (generalized): Secondary | ICD-10-CM | POA: Diagnosis not present

## 2020-06-19 DIAGNOSIS — E785 Hyperlipidemia, unspecified: Secondary | ICD-10-CM | POA: Diagnosis not present

## 2020-06-19 DIAGNOSIS — M25512 Pain in left shoulder: Secondary | ICD-10-CM | POA: Diagnosis not present

## 2020-06-19 DIAGNOSIS — M19012 Primary osteoarthritis, left shoulder: Secondary | ICD-10-CM | POA: Diagnosis not present

## 2020-06-21 DIAGNOSIS — M19012 Primary osteoarthritis, left shoulder: Secondary | ICD-10-CM | POA: Diagnosis not present

## 2020-06-21 DIAGNOSIS — E785 Hyperlipidemia, unspecified: Secondary | ICD-10-CM | POA: Diagnosis not present

## 2020-06-21 DIAGNOSIS — M6281 Muscle weakness (generalized): Secondary | ICD-10-CM | POA: Diagnosis not present

## 2020-06-21 DIAGNOSIS — M25512 Pain in left shoulder: Secondary | ICD-10-CM | POA: Diagnosis not present

## 2020-06-21 DIAGNOSIS — I25118 Atherosclerotic heart disease of native coronary artery with other forms of angina pectoris: Secondary | ICD-10-CM | POA: Diagnosis not present

## 2020-06-26 DIAGNOSIS — M19012 Primary osteoarthritis, left shoulder: Secondary | ICD-10-CM | POA: Diagnosis not present

## 2020-06-26 DIAGNOSIS — M6281 Muscle weakness (generalized): Secondary | ICD-10-CM | POA: Diagnosis not present

## 2020-06-26 DIAGNOSIS — M25512 Pain in left shoulder: Secondary | ICD-10-CM | POA: Diagnosis not present

## 2020-06-28 DIAGNOSIS — M6281 Muscle weakness (generalized): Secondary | ICD-10-CM | POA: Diagnosis not present

## 2020-06-28 DIAGNOSIS — M19012 Primary osteoarthritis, left shoulder: Secondary | ICD-10-CM | POA: Diagnosis not present

## 2020-06-28 DIAGNOSIS — M25512 Pain in left shoulder: Secondary | ICD-10-CM | POA: Diagnosis not present

## 2020-06-29 DIAGNOSIS — N3289 Other specified disorders of bladder: Secondary | ICD-10-CM | POA: Diagnosis not present

## 2020-06-29 DIAGNOSIS — C61 Malignant neoplasm of prostate: Secondary | ICD-10-CM | POA: Diagnosis not present

## 2020-06-29 DIAGNOSIS — N4289 Other specified disorders of prostate: Secondary | ICD-10-CM | POA: Diagnosis not present

## 2020-07-03 ENCOUNTER — Encounter: Payer: Self-pay | Admitting: Family

## 2020-07-03 ENCOUNTER — Ambulatory Visit: Payer: Medicare HMO | Admitting: Internal Medicine

## 2020-07-03 ENCOUNTER — Other Ambulatory Visit: Payer: Self-pay

## 2020-07-03 ENCOUNTER — Ambulatory Visit: Payer: Medicare HMO | Admitting: Family

## 2020-07-03 VITALS — BP 128/70 | HR 67 | Ht 67.0 in | Wt 188.0 lb

## 2020-07-03 DIAGNOSIS — I2510 Atherosclerotic heart disease of native coronary artery without angina pectoris: Secondary | ICD-10-CM | POA: Diagnosis not present

## 2020-07-03 DIAGNOSIS — M25512 Pain in left shoulder: Secondary | ICD-10-CM | POA: Diagnosis not present

## 2020-07-03 DIAGNOSIS — M19012 Primary osteoarthritis, left shoulder: Secondary | ICD-10-CM | POA: Diagnosis not present

## 2020-07-03 DIAGNOSIS — E785 Hyperlipidemia, unspecified: Secondary | ICD-10-CM

## 2020-07-03 DIAGNOSIS — I10 Essential (primary) hypertension: Secondary | ICD-10-CM | POA: Diagnosis not present

## 2020-07-03 DIAGNOSIS — I428 Other cardiomyopathies: Secondary | ICD-10-CM

## 2020-07-03 DIAGNOSIS — M6281 Muscle weakness (generalized): Secondary | ICD-10-CM | POA: Diagnosis not present

## 2020-07-03 NOTE — Progress Notes (Signed)
Office Visit    Patient Name: Larry Allen Date of Encounter: 07/03/2020  PCP:  Jefm Petty, MD   [place]  Cardiologist:  Nelva Bush, MD  Advanced Practice Provider:  No care team member to display Electrophysiologist:  None   Chief Complaint    Larry Allen is a 80 y.o. male with a hx of hypertension, hyperlipidemia, prostate cancer, coronary artery disease, nonischemic cardiomyopathy, PVC presents today for follow up of cardiomyopathy.   Past Medical History    Past Medical History:  Diagnosis Date  . Cancer [facility]) 2016   prostate cancer   . HLD (hyperlipidemia)   . Hypertension   . Prostate cancer ([place])   . Thrombocytopenia ([place])    Past Surgical History:  Procedure Laterality Date  . APPENDECTOMY    . HERNIA REPAIR  2020  . JOINT REPLACEMENT  2020  . LEFT HEART CATH AND CORONARY ANGIOGRAPHY N/A 03/12/2020   Procedure: LEFT HEART CATH AND CORONARY ANGIOGRAPHY;  Surgeon: Nelva Bush, MD;  Location: [place] CV LAB;  Service: Cardiovascular;  Laterality: N/A;  . TOTAL SHOULDER ARTHROPLASTY Left 04/30/2020   Procedure: TOTAL SHOULDER ARTHROPLASTY;  Surgeon: Corky Mull, MD;  Location: [facility] ORS;  Service: Orthopedics;  Laterality: Left;    Allergies  Allergies  Allergen Reactions  . Amoxicillin Swelling and Rash    History of Present Illness    Larry Allen is a 80 y.o. male with a hx of hypertension, hyperlipidemia, prostate cancer, coronary artery disease, nonischemic cardiomyopathy, PVC last seen 03/20/20 by Dr. Saunders Revel.  He was seen initially by Dr. Saunders Revel 02/2020 for preoperative cardiovascular risk assessment prior to left shoulder surgery. He had echo 02/29/20 showing LVEF 40-45%, gr1DD, RV normal size and function, mild pulmonary hypertension, mild to moderate MR, mild TR, mild aortic sclerosis wtihout stenosis.. Myocardial perfusion stress test was intermediate risk with small apical inferior defect and mildly  reduced LVEF. Cardiac catheterization 03/12/20 with moderate nonobstructive CAD with 50% proximal RCA stenosis and mildly elevated LVEDP. He was recommended for medical therapy.   He presents today for follow up with his wife. Reports no shortness of breath nor dyspnea on exertion. Reports no chest pain, pressure, or tightness. No edema, orthopnea, PND. Reports no palpitations. Endorses feeling overall well and has no concerns related to his heart. He is working with PT after his shoulder surgery and very pleased with his progress. Endorses following a heart healthy diet. Reports home blood pressures routinely 120s/80s. He and his wife are excited for an upcoming trip to [facility].  EKGs/Labs/Other Studies Reviewed:   The following studies were reviewed today:  Cath/PCI:  LHC (03/12/2020): LMCA, LAD, ramus intermedius, and LCx normal.  Tortuous RCA with 50% proximal stenosis.  LVEDP 15-20 mmHg.  Non-Invasive Evaluation(s):  Pharmacologic MPI (03/04/2020): Intermediate risk study with small fixed apical inferior defect suggestive of scar.  LVEF 45-54%.  Coronary artery calcification present.  TTE (02/29/2020): Normal LV size and wall thickness.  LVEF 40-45% with global hypokinesis and grade 1 diastolic dysfunction.  Normal RV size and function.  Mild pulmonary hypertension.  Mild to moderate mitral regurgitation.  Mild tricuspid regurgitation.  Mild aortic sclerosis without stenosis.  EKG:  No EKG today.   Recent Labs: 02/28/2020: ALT 14; BUN 19; Creatinine, Ser 0.75; Hemoglobin 13.5; Platelets 141; Potassium 3.8; Sodium 140  Recent Lipid Panel No results found for: CHOL, TRIG, HDL, CHOLHDL, VLDL, LDLCALC, LDLDIRECT   Home Medications   No outpatient medications have been  marked as taking for the 07/03/20 encounter (Appointment) with Loel Dubonnet, NP.     Review of Systems  All other systems reviewed and are otherwise negative except as noted above.  Physical Exam    VS:  There  were no vitals taken for this visit. , BMI There is no height or weight on file to calculate BMI.  Wt Readings from Last 3 Encounters:  04/30/20 178 lb (80.7 kg)  03/20/20 182 lb (82.6 kg)  03/12/20 179 lb 14.3 oz (81.6 kg)    GEN: Well nourished, well developed, in no acute distress. HEENT: normal. Neck: Supple, no JVD, carotid bruits, or masses. Cardiac: RRR, no murmurs, rubs, or gallops. No clubbing, cyanosis, edema.  Radials/PT 2+ and equal bilaterally.  Respiratory:  Respirations regular and unlabored, clear to auscultation bilaterally. GI: Soft, nontender, nondistended. MS: No deformity or atrophy. Skin: Warm and dry, no rash. Neuro:  Strength and sensation are intact. Psych: Normal affect.  Assessment & Plan    1. Nonobstructive coronary artery disease - Cardiac cath 02/2020 50% mid RCA stenosis recommended for medical management. GMDT includes aspirin, high intensity statin, coreg. No anginal symptoms, no indication for ischemic evaluation. Heart healthy diet and regular cardiovascular exercise encouraged.   2. Nonischemic cardiomyopathy - Euvolemic and well compensated. NYHA I. GDMT includes Coreg, Lisinopril. No indication for loop diuretic at this time. Plan to repeat echocardiogram for reassessment of LVEF. If LVEF remains depressed, consider ZIO to assess PVC burden and addition of Spironolactone. If LVEF improved, continue current regimen.   3. HTN - BP well controlled. Continue current antihypertensive regimen.   4. HLD, LDL goal <70 - Normal liver function 04/2020. 06/2020 total choletserol 118, HDL 43, LDL 61, triglycerides 53. Continue Rosuvastatin 20mg  daily.  5. PVC - Noted on previous EKG. Denies palpitations. Continue present dose of Coreg.   Disposition: Follow up in 2-3 month(s) with Dr. Saunders Revel or APP   Signed, Loel Dubonnet, NP 07/03/2020, 7:57 AM [place]

## 2020-07-03 NOTE — Patient Instructions (Addendum)
Medication Instructions:  Continue your current medications.   *If you need a refill on your cardiac medications before your next appointment, please call your pharmacy*  Lab Work: None ordered today.   Testing/Procedures: Your physician has requested that you have an echocardiogram. Echocardiography is a painless test that uses sound waves to create images of your heart. It provides your doctor with information about the size and shape of your heart and how well your heart's chambers and valves are working. This procedure takes approximately one hour. There are no restrictions for this procedure. There is a possibility that an IV may need to be started during your test to inject an image enhancing agent. This is done to obtain more optimal pictures of your heart. Therefore we ask that you do at least drink some water prior to coming in to hydrate your veins.   Follow-Up: At [facility], you and your health needs are our priority.  As part of our continuing mission to provide you with exceptional heart care, we have created designated Provider Care Teams.  These Care Teams include your primary Cardiologist (physician) and Advanced Practice Providers (APPs -  Physician Assistants and Nurse Practitioners) who all work together to provide you with the care you need, when you need it.  We recommend signing up for the patient portal called "MyChart".  Sign up information is provided on this After Visit Summary.  MyChart is used to connect with patients for Virtual Visits (Telemedicine).  Patients are able to view lab/test results, encounter notes, upcoming appointments, etc.  Non-urgent messages can be sent to your provider as well.   To learn more about what you can do with MyChart, go to [email or web address].    Your next appointment:   2-3  month(s)  The format for your next appointment:   In Person  Provider:   You may see Nelva Bush, MD or one of the following Advanced Practice  Providers on your designated Care Team:    Murray Hodgkins, NP  Christell Faith, PA-C  Marrianne Mood, PA-C  Cadence Kathlen Mody, [place]  Laurann Montana, NP  Other Instructions  Heart Healthy Diet Recommendations: A low-salt diet is recommended. Meats should be grilled, baked, or boiled. Avoid fried foods. Focus on lean protein sources like fish or chicken with vegetables and fruits. The American Heart Association is a Microbiologist!  American Heart Association Diet and Lifeystyle Recommendations   Exercise recommendations: The American Heart Association recommends 150 minutes of moderate intensity exercise weekly. Try 30 minutes of moderate intensity exercise 4-5 times per week. This could include walking, jogging, or swimming.

## 2020-07-05 DIAGNOSIS — M6281 Muscle weakness (generalized): Secondary | ICD-10-CM | POA: Diagnosis not present

## 2020-07-05 DIAGNOSIS — M25512 Pain in left shoulder: Secondary | ICD-10-CM | POA: Diagnosis not present

## 2020-07-05 DIAGNOSIS — M19012 Primary osteoarthritis, left shoulder: Secondary | ICD-10-CM | POA: Diagnosis not present

## 2020-07-10 DIAGNOSIS — M25512 Pain in left shoulder: Secondary | ICD-10-CM | POA: Diagnosis not present

## 2020-07-10 DIAGNOSIS — M6281 Muscle weakness (generalized): Secondary | ICD-10-CM | POA: Diagnosis not present

## 2020-07-10 DIAGNOSIS — M19012 Primary osteoarthritis, left shoulder: Secondary | ICD-10-CM | POA: Diagnosis not present

## 2020-07-12 DIAGNOSIS — M6281 Muscle weakness (generalized): Secondary | ICD-10-CM | POA: Diagnosis not present

## 2020-07-12 DIAGNOSIS — M19012 Primary osteoarthritis, left shoulder: Secondary | ICD-10-CM | POA: Diagnosis not present

## 2020-07-12 DIAGNOSIS — M25512 Pain in left shoulder: Secondary | ICD-10-CM | POA: Diagnosis not present

## 2020-07-17 DIAGNOSIS — M25512 Pain in left shoulder: Secondary | ICD-10-CM | POA: Diagnosis not present

## 2020-07-17 DIAGNOSIS — M6281 Muscle weakness (generalized): Secondary | ICD-10-CM | POA: Diagnosis not present

## 2020-07-17 DIAGNOSIS — M19012 Primary osteoarthritis, left shoulder: Secondary | ICD-10-CM | POA: Diagnosis not present

## 2020-07-30 ENCOUNTER — Ambulatory Visit (INDEPENDENT_AMBULATORY_CARE_PROVIDER_SITE_OTHER): Payer: Medicare HMO

## 2020-07-30 ENCOUNTER — Other Ambulatory Visit: Payer: Self-pay

## 2020-07-30 DIAGNOSIS — I428 Other cardiomyopathies: Secondary | ICD-10-CM | POA: Diagnosis not present

## 2020-07-30 LAB — ECHOCARDIOGRAM COMPLETE
AR max vel: 2.51 cm2
AV Area VTI: 2.79 cm2
AV Area mean vel: 2.45 cm2
AV Mean grad: 4 mmHg
AV Peak grad: 7.1 mmHg
Ao pk vel: 1.34 m/s
Area-P 1/2: 3.48 cm2
Calc EF: 40.8 %
S' Lateral: 4.2 cm
Single Plane A2C EF: 38.1 %
Single Plane A4C EF: 43.3 %

## 2020-07-31 ENCOUNTER — Telehealth: Payer: Self-pay

## 2020-07-31 DIAGNOSIS — M6281 Muscle weakness (generalized): Secondary | ICD-10-CM | POA: Diagnosis not present

## 2020-07-31 DIAGNOSIS — M19012 Primary osteoarthritis, left shoulder: Secondary | ICD-10-CM | POA: Diagnosis not present

## 2020-07-31 DIAGNOSIS — M25512 Pain in left shoulder: Secondary | ICD-10-CM | POA: Diagnosis not present

## 2020-07-31 MED ORDER — FUROSEMIDE 20 MG PO TABS: 20.0000 mg | ORAL_TABLET | Freq: Every day | ORAL | 3 refills | Status: DC | PRN

## 2020-07-31 NOTE — Telephone Encounter (Signed)
Able to reach pt regarding his recent  Echo , Laurann Montana, NP had a chance to review his results and advised   "Echo with LVEF improved compared to previous but not quite to normal. Stable moderate MR. Elevated left atrial pressure suggestive of some fluid overload. Recommend Lasix 20mg  daily x 3 days then as needed for weight gain of 2 pounds overnight or 5 pounds in one week."  Pt will pick up newly order medications at his local pharmacy in [place], him and wife verbalized mediation instruction. Both are grateful for the results phone call,  all questions or concerns were address and no additional concerns at this time. Agreeable to plan, will call back for anything further.  Will keep f/u appt with Dr. Saunders Revel in Aug.

## 2020-08-02 DIAGNOSIS — M25512 Pain in left shoulder: Secondary | ICD-10-CM | POA: Diagnosis not present

## 2020-08-02 DIAGNOSIS — M19012 Primary osteoarthritis, left shoulder: Secondary | ICD-10-CM | POA: Diagnosis not present

## 2020-08-02 DIAGNOSIS — M6281 Muscle weakness (generalized): Secondary | ICD-10-CM | POA: Diagnosis not present

## 2020-08-14 DIAGNOSIS — M19012 Primary osteoarthritis, left shoulder: Secondary | ICD-10-CM | POA: Diagnosis not present

## 2020-08-14 DIAGNOSIS — M25512 Pain in left shoulder: Secondary | ICD-10-CM | POA: Diagnosis not present

## 2020-08-14 DIAGNOSIS — M6281 Muscle weakness (generalized): Secondary | ICD-10-CM | POA: Diagnosis not present

## 2020-08-21 DIAGNOSIS — H2513 Age-related nuclear cataract, bilateral: Secondary | ICD-10-CM | POA: Diagnosis not present

## 2020-08-21 DIAGNOSIS — H52203 Unspecified astigmatism, bilateral: Secondary | ICD-10-CM | POA: Diagnosis not present

## 2020-08-21 DIAGNOSIS — H5201 Hypermetropia, right eye: Secondary | ICD-10-CM | POA: Diagnosis not present

## 2020-08-21 DIAGNOSIS — H524 Presbyopia: Secondary | ICD-10-CM | POA: Diagnosis not present

## 2020-08-21 DIAGNOSIS — H02831 Dermatochalasis of right upper eyelid: Secondary | ICD-10-CM | POA: Diagnosis not present

## 2020-08-21 DIAGNOSIS — H02834 Dermatochalasis of left upper eyelid: Secondary | ICD-10-CM | POA: Diagnosis not present

## 2020-08-21 DIAGNOSIS — H35371 Puckering of macula, right eye: Secondary | ICD-10-CM | POA: Diagnosis not present

## 2020-08-21 DIAGNOSIS — H11153 Pinguecula, bilateral: Secondary | ICD-10-CM | POA: Diagnosis not present

## 2020-08-21 DIAGNOSIS — H25043 Posterior subcapsular polar age-related cataract, bilateral: Secondary | ICD-10-CM | POA: Diagnosis not present

## 2020-08-21 DIAGNOSIS — H5212 Myopia, left eye: Secondary | ICD-10-CM | POA: Diagnosis not present

## 2020-09-09 DIAGNOSIS — C61 Malignant neoplasm of prostate: Secondary | ICD-10-CM | POA: Diagnosis not present

## 2020-09-18 DIAGNOSIS — C61 Malignant neoplasm of prostate: Secondary | ICD-10-CM | POA: Diagnosis not present

## 2020-09-18 DIAGNOSIS — R69 Illness, unspecified: Secondary | ICD-10-CM | POA: Diagnosis not present

## 2020-09-18 DIAGNOSIS — I10 Essential (primary) hypertension: Secondary | ICD-10-CM | POA: Diagnosis not present

## 2020-09-23 DIAGNOSIS — I429 Cardiomyopathy, unspecified: Secondary | ICD-10-CM | POA: Diagnosis not present

## 2020-09-23 DIAGNOSIS — I25708 Atherosclerosis of coronary artery bypass graft(s), unspecified, with other forms of angina pectoris: Secondary | ICD-10-CM | POA: Diagnosis not present

## 2020-09-23 DIAGNOSIS — C61 Malignant neoplasm of prostate: Secondary | ICD-10-CM | POA: Diagnosis not present

## 2020-10-15 DIAGNOSIS — C61 Malignant neoplasm of prostate: Secondary | ICD-10-CM | POA: Diagnosis not present

## 2020-10-16 ENCOUNTER — Other Ambulatory Visit: Payer: Self-pay

## 2020-10-16 ENCOUNTER — Ambulatory Visit: Payer: Medicare HMO | Admitting: Internal Medicine

## 2020-10-16 ENCOUNTER — Encounter: Payer: Self-pay | Admitting: Internal Medicine

## 2020-10-16 VITALS — BP 110/60 | HR 78 | Ht 66.0 in | Wt 189.0 lb

## 2020-10-16 DIAGNOSIS — I10 Essential (primary) hypertension: Secondary | ICD-10-CM

## 2020-10-16 DIAGNOSIS — I429 Cardiomyopathy, unspecified: Secondary | ICD-10-CM

## 2020-10-16 DIAGNOSIS — I428 Other cardiomyopathies: Secondary | ICD-10-CM

## 2020-10-16 DIAGNOSIS — I2510 Atherosclerotic heart disease of native coronary artery without angina pectoris: Secondary | ICD-10-CM | POA: Diagnosis not present

## 2020-10-16 DIAGNOSIS — I452 Bifascicular block: Secondary | ICD-10-CM

## 2020-10-16 HISTORY — DX: Bifascicular block: I45.2

## 2020-10-16 NOTE — Patient Instructions (Signed)
Medication Instructions:   Your physician recommends that you continue on your current medications as directed. Please refer to the Current Medication list given to you today.  *If you need a refill on your cardiac medications before your next appointment, please call your pharmacy*   Lab Work:  None ordered  Testing/Procedures:  None ordered   Follow-Up: At [facility], you and your health needs are our priority.  As part of our continuing mission to provide you with exceptional heart care, we have created designated Provider Care Teams.  These Care Teams include your primary Cardiologist (physician) and Advanced Practice Providers (APPs -  Physician Assistants and Nurse Practitioners) who all work together to provide you with the care you need, when you need it.  We recommend signing up for the patient portal called "MyChart".  Sign up information is provided on this After Visit Summary.  MyChart is used to connect with patients for Virtual Visits (Telemedicine).  Patients are able to view lab/test results, encounter notes, upcoming appointments, etc.  Non-urgent messages can be sent to your provider as well.   To learn more about what you can do with MyChart, go to [email or web address].    Your next appointment:   6 month(s)  The format for your next appointment:   In Person  Provider:   You may see Nelva Bush, MD or one of the following Advanced Practice Providers on your designated Care Team:   Murray Hodgkins, NP Christell Faith, PA-C Marrianne Mood, PA-C Cadence Kathlen Mody, [place]   Other Instructions  Please monitor your blood pressure at home. If your BP is consistently greater than 130/90 contact our office.

## 2020-10-16 NOTE — Progress Notes (Signed)
Follow-up Outpatient Visit Date: 10/16/2020  Primary Care Provider: Jefm Petty, MD [id number] [place] [id number]  Chief Complaint: Follow-up cardiomyopathy  HPI:  Mr. Currier is a 80 y.o. male with history of nonobstructive coronary artery disease, nonischemic cardiomyopathy with moderately reduced LVEF by echo (40-45%), hypertension, hyperlipidemia, and prostate cancer, who presents for follow-up of cardiomyopathy and coronary artery disease.  He was last seen in our office by Laurann Montana, NP, in April.  At that time he was feeling well, having completed shoulder surgery.  Repeat echocardiogram in May showed slight improvement in left ventricular systolic function ([id number] -> 45-50%).  Today, Mr. Servedio reports that he feels quite well.  He notices mild shortness of breath when he is in a "big hurry.  Otherwise, he has been without dyspnea or chest pain.  He has returned to exercising on the treadmill without limitations.  He denies palpitations, lightheadedness,.  He is tolerating his medications well.  --------------------------------------------------------------------------------------------------  Cardiovascular History & Procedures: Cardiovascular Problems: Moderate coronary artery disease Nonischemic cardiomyopathy   Risk Factors: Hypertension, hyperlipidemia, male gender, and age greater than 42   Cath/PCI: LHC (03/12/2020): LMCA, LAD, ramus intermedius, and LCx normal.  Tortuous RCA with 50% proximal stenosis.  LVEDP 15-20 mmHg.   CV Surgery: None   EP Procedures and Devices: None   Non-Invasive Evaluation(s): TTE (07/30/2020): Normal LV size and wall thickness.  LVEF 45-50% with global hypokinesis and grade 1 diastolic dysfunction.  Elevated filling pressure noted.  Mild pulmonary hypertension with mild dilation of the right ventricle.  Mild left atrial enlargement.  Moderate mitral regurgitation and mild tricuspid regurgitation  present. Pharmacologic MPI (03/04/2020): Intermediate risk study with small fixed apical inferior defect suggestive of scar.  LVEF 45-54%.  Coronary artery calcification present. TTE (02/29/2020): Normal LV size and wall thickness.  LVEF 40-45% with global hypokinesis and grade 1 diastolic dysfunction.  Normal RV size and function.  Mild pulmonary hypertension.  Mild to moderate mitral regurgitation.  Mild tricuspid regurgitation.  Mild aortic sclerosis without stenosis.  Recent CV Pertinent Labs: Lab Results  Component Value Date   K 3.8 02/28/2020   BUN 19 02/28/2020   CREATININE 0.75 02/28/2020    Past medical and surgical history were reviewed and updated in EPIC.  Current Meds  Medication Sig   acetaminophen (TYLENOL) 500 MG tablet Take 500 mg by mouth every 6 (six) hours as needed for moderate pain or mild pain (for pain.).   aspirin EC 81 MG tablet Take 81 mg by mouth daily. Swallow whole.   calcium carbonate (OSCAL) 1500 (600 Ca) MG TABS tablet Take 600 mg by mouth daily.   carvedilol (COREG) 3.125 MG tablet Take 1 tablet (3.125 mg total) by mouth 2 (two) times daily with a meal.   cetirizine (ZYRTEC) 10 MG tablet Take 10 mg by mouth at bedtime.   cholecalciferol (VITAMIN D) 25 MCG (1000 UNIT) tablet Take 1,000 Units by mouth daily.   Coenzyme Q10 (COQ10) 200 MG CAPS Take 200 mg by mouth at bedtime.   docusate sodium (COLACE) 50 MG capsule Take 50 mg by mouth daily.   fluticasone (FLONASE) 50 MCG/ACT nasal spray Place 1 spray into both nostrils 2 (two) times a week.   furosemide (LASIX) 20 MG tablet Take 1 tablet (20 mg total) by mouth daily as needed for fluid (as needed for weight gain of 2 pounds overnight or 5 pounds in one week.). Take for 3 consecutive days, then PRN  lisinopril-hydrochlorothiazide (ZESTORETIC) 20-12.5 MG tablet Take 1 tablet by mouth daily.   Multiple Vitamin (MULTIVITAMIN WITH MINERALS) TABS tablet Take 1 tablet by mouth daily. 50 +   OVER THE COUNTER  MEDICATION Take 1-2 capsules by mouth See admin instructions. Balance of Carolynne Edouard take 2 Capsule in the morning and 1 capsule at bedtime   OVER THE COUNTER MEDICATION Take 1-2 capsules by mouth See admin instructions. Balance of Nature Fruit  Take 2 capsules in the morning and 1 capsule at bedtime   polyethylene glycol (MIRALAX / GLYCOLAX) 17 g packet Take 17 g by mouth daily.   Probiotic Product (PROBIOTIC DAILY) CAPS Take 1 capsule by mouth daily.   rosuvastatin (CRESTOR) 20 MG tablet Take 20 mg by mouth at bedtime.   sildenafil (REVATIO) 20 MG tablet Take 20 mg by mouth as needed (ED).   vitamin B-12 (CYANOCOBALAMIN) 1000 MCG tablet Take 1,000 mcg by mouth daily.   zolpidem (AMBIEN) 5 MG tablet Take 5 mg by mouth at bedtime as needed for sleep.    Allergies: Amoxicillin  Social History   Tobacco Use   Smoking status: Never   Smokeless tobacco: Never  Vaping Use   Vaping Use: Never used  Substance Use Topics   Alcohol use: Never   Drug use: Never    Family History  Problem Relation Age of Onset   Heart Problems Mother    Heart Problems Sister    Heart Problems Brother    Heart Problems Brother     Review of Systems: A 12-system review of systems was performed and was negative except as noted in the HPI.  --------------------------------------------------------------------------------------------------  Physical Exam: BP 110/60 (BP Location: Left Arm, Patient Position: Sitting, Cuff Size: Normal)   Pulse 78   Ht '5\' 6"'$  (1.676 m)   Wt 189 lb (85.7 kg)   SpO2 98%   BMI 30.51 kg/m   General:  NAD. Neck: No JVD or HJR. Lungs: Clear to auscultation bilaterally without wheezes or crackles. Heart: Regular rate and rhythm without murmurs, rubs, or gallops. Abdomen: Soft, nontender, nondistended. Extremities: No lower extremity edema.  EKG: Normal sinus rhythm with PVC, left atrial enlargement, bifascicular block (LAFB and RBBB), and poor R wave progression.   Bifascicular block is new since 05/01/2020.  Lab Results  Component Value Date   WBC 7.5 02/28/2020   HGB 13.5 02/28/2020   HCT 40.0 02/28/2020   MCV 94.3 02/28/2020   PLT 141 (L) 02/28/2020    Lab Results  Component Value Date   NA 140 02/28/2020   K 3.8 02/28/2020   CL 107 02/28/2020   CO2 27 02/28/2020   BUN 19 02/28/2020   CREATININE 0.75 02/28/2020   GLUCOSE 99 02/28/2020   ALT 14 02/28/2020    No results found for: CHOL, HDL, LDLCALC, LDLDIRECT, TRIG, CHOLHDL  --------------------------------------------------------------------------------------------------  ASSESSMENT AND PLAN: Nonischemic cardiomyopathy: Mr. Dechristopher feels well without signs or symptoms of decompensated heart failure.  He has only mild exertional dyspnea with strenuous, consistent with NYHA I-II heart failure.  Follow-up echo showed improvement in LVEF to 45-50%.  EKG today is notable for new bifascicular block plan previously LVH with QRS widening).  We will plan to continue current regimen of carvedilol 3.125 mg twice daily, furosemide 20 mg daily as needed, and lisinopril-HCTZ 12.5 mg daily.  Given his minimal symptoms and borderline low blood pressure, dose escalation has been deferred.  Coronary artery disease: Moderate, nonobstructive CAD noted by catheterization.  Given absence of symptoms, we  will continue with medical therapy including rosuvastatin.  Ongoing follow-up of lipids per Dr. Jeralene Huff (goal LDL less than 70).  Hypertension: Blood pressure low normal today.  Continue current medications.  Follow-up: Return to clinic in 6 months.  Nelva Bush, MD 10/16/2020 2:16 PM

## 2020-10-17 ENCOUNTER — Encounter: Payer: Self-pay | Admitting: Internal Medicine

## 2020-10-17 DIAGNOSIS — I2510 Atherosclerotic heart disease of native coronary artery without angina pectoris: Secondary | ICD-10-CM | POA: Insufficient documentation

## 2020-10-21 DIAGNOSIS — Z79899 Other long term (current) drug therapy: Secondary | ICD-10-CM | POA: Diagnosis not present

## 2020-10-21 DIAGNOSIS — C61 Malignant neoplasm of prostate: Secondary | ICD-10-CM | POA: Diagnosis not present

## 2020-10-21 DIAGNOSIS — I10 Essential (primary) hypertension: Secondary | ICD-10-CM | POA: Diagnosis not present

## 2020-10-21 DIAGNOSIS — Z880 Allergy status to penicillin: Secondary | ICD-10-CM | POA: Diagnosis not present

## 2020-10-21 DIAGNOSIS — Z7982 Long term (current) use of aspirin: Secondary | ICD-10-CM | POA: Diagnosis not present

## 2020-10-25 DIAGNOSIS — M19011 Primary osteoarthritis, right shoulder: Secondary | ICD-10-CM | POA: Diagnosis not present

## 2020-10-28 DIAGNOSIS — Z96612 Presence of left artificial shoulder joint: Secondary | ICD-10-CM | POA: Diagnosis not present

## 2020-10-28 DIAGNOSIS — M25512 Pain in left shoulder: Secondary | ICD-10-CM | POA: Diagnosis not present

## 2020-10-28 DIAGNOSIS — M19012 Primary osteoarthritis, left shoulder: Secondary | ICD-10-CM | POA: Diagnosis not present

## 2020-12-16 DIAGNOSIS — C61 Malignant neoplasm of prostate: Secondary | ICD-10-CM | POA: Diagnosis not present

## 2020-12-18 DIAGNOSIS — C61 Malignant neoplasm of prostate: Secondary | ICD-10-CM | POA: Diagnosis not present

## 2021-01-22 DIAGNOSIS — Z96652 Presence of left artificial knee joint: Secondary | ICD-10-CM | POA: Diagnosis not present

## 2021-01-22 DIAGNOSIS — Z471 Aftercare following joint replacement surgery: Secondary | ICD-10-CM | POA: Diagnosis not present

## 2021-01-22 DIAGNOSIS — M1120 Other chondrocalcinosis, unspecified site: Secondary | ICD-10-CM | POA: Diagnosis not present

## 2021-01-22 DIAGNOSIS — M25462 Effusion, left knee: Secondary | ICD-10-CM | POA: Diagnosis not present

## 2021-01-22 DIAGNOSIS — R229 Localized swelling, mass and lump, unspecified: Secondary | ICD-10-CM | POA: Diagnosis not present

## 2021-01-22 DIAGNOSIS — M25861 Other specified joint disorders, right knee: Secondary | ICD-10-CM | POA: Diagnosis not present

## 2021-01-22 DIAGNOSIS — M1711 Unilateral primary osteoarthritis, right knee: Secondary | ICD-10-CM | POA: Diagnosis not present

## 2021-01-22 DIAGNOSIS — R2241 Localized swelling, mass and lump, right lower limb: Secondary | ICD-10-CM | POA: Diagnosis not present

## 2021-02-10 ENCOUNTER — Other Ambulatory Visit: Payer: Self-pay | Admitting: Internal Medicine

## 2021-02-26 DIAGNOSIS — H25043 Posterior subcapsular polar age-related cataract, bilateral: Secondary | ICD-10-CM | POA: Diagnosis not present

## 2021-02-26 DIAGNOSIS — H2513 Age-related nuclear cataract, bilateral: Secondary | ICD-10-CM | POA: Diagnosis not present

## 2021-02-26 DIAGNOSIS — H524 Presbyopia: Secondary | ICD-10-CM | POA: Diagnosis not present

## 2021-02-26 DIAGNOSIS — H52203 Unspecified astigmatism, bilateral: Secondary | ICD-10-CM | POA: Diagnosis not present

## 2021-02-26 DIAGNOSIS — H35372 Puckering of macula, left eye: Secondary | ICD-10-CM | POA: Diagnosis not present

## 2021-02-26 DIAGNOSIS — H5201 Hypermetropia, right eye: Secondary | ICD-10-CM | POA: Diagnosis not present

## 2021-02-26 DIAGNOSIS — H5212 Myopia, left eye: Secondary | ICD-10-CM | POA: Diagnosis not present

## 2021-03-21 DIAGNOSIS — E785 Hyperlipidemia, unspecified: Secondary | ICD-10-CM | POA: Diagnosis not present

## 2021-03-21 DIAGNOSIS — M8588 Other specified disorders of bone density and structure, other site: Secondary | ICD-10-CM | POA: Diagnosis not present

## 2021-03-21 DIAGNOSIS — M8580 Other specified disorders of bone density and structure, unspecified site: Secondary | ICD-10-CM | POA: Diagnosis not present

## 2021-03-21 DIAGNOSIS — D696 Thrombocytopenia, unspecified: Secondary | ICD-10-CM | POA: Diagnosis not present

## 2021-03-21 DIAGNOSIS — I10 Essential (primary) hypertension: Secondary | ICD-10-CM | POA: Diagnosis not present

## 2021-03-21 DIAGNOSIS — Z0001 Encounter for general adult medical examination with abnormal findings: Secondary | ICD-10-CM | POA: Diagnosis not present

## 2021-03-26 DIAGNOSIS — I10 Essential (primary) hypertension: Secondary | ICD-10-CM | POA: Diagnosis not present

## 2021-03-26 DIAGNOSIS — E785 Hyperlipidemia, unspecified: Secondary | ICD-10-CM | POA: Diagnosis not present

## 2021-03-26 DIAGNOSIS — Z0000 Encounter for general adult medical examination without abnormal findings: Secondary | ICD-10-CM | POA: Diagnosis not present

## 2021-03-26 DIAGNOSIS — N529 Male erectile dysfunction, unspecified: Secondary | ICD-10-CM | POA: Diagnosis not present

## 2021-03-26 DIAGNOSIS — D696 Thrombocytopenia, unspecified: Secondary | ICD-10-CM | POA: Diagnosis not present

## 2021-03-26 DIAGNOSIS — M8580 Other specified disorders of bone density and structure, unspecified site: Secondary | ICD-10-CM | POA: Diagnosis not present

## 2021-03-26 DIAGNOSIS — C61 Malignant neoplasm of prostate: Secondary | ICD-10-CM | POA: Diagnosis not present

## 2021-03-26 DIAGNOSIS — J3089 Other allergic rhinitis: Secondary | ICD-10-CM | POA: Diagnosis not present

## 2021-03-26 DIAGNOSIS — I429 Cardiomyopathy, unspecified: Secondary | ICD-10-CM | POA: Diagnosis not present

## 2021-03-26 DIAGNOSIS — I25118 Atherosclerotic heart disease of native coronary artery with other forms of angina pectoris: Secondary | ICD-10-CM | POA: Diagnosis not present

## 2021-03-26 DIAGNOSIS — R69 Illness, unspecified: Secondary | ICD-10-CM | POA: Diagnosis not present

## 2021-04-07 DIAGNOSIS — D1801 Hemangioma of skin and subcutaneous tissue: Secondary | ICD-10-CM | POA: Diagnosis not present

## 2021-04-07 DIAGNOSIS — L308 Other specified dermatitis: Secondary | ICD-10-CM | POA: Diagnosis not present

## 2021-04-07 DIAGNOSIS — L570 Actinic keratosis: Secondary | ICD-10-CM | POA: Diagnosis not present

## 2021-04-07 DIAGNOSIS — L821 Other seborrheic keratosis: Secondary | ICD-10-CM | POA: Diagnosis not present

## 2021-04-07 DIAGNOSIS — Z85828 Personal history of other malignant neoplasm of skin: Secondary | ICD-10-CM | POA: Diagnosis not present

## 2021-04-07 DIAGNOSIS — L578 Other skin changes due to chronic exposure to nonionizing radiation: Secondary | ICD-10-CM | POA: Diagnosis not present

## 2021-04-11 DIAGNOSIS — M7581 Other shoulder lesions, right shoulder: Secondary | ICD-10-CM | POA: Diagnosis not present

## 2021-04-11 DIAGNOSIS — M19011 Primary osteoarthritis, right shoulder: Secondary | ICD-10-CM | POA: Diagnosis not present

## 2021-04-14 ENCOUNTER — Telehealth: Payer: Self-pay | Admitting: Internal Medicine

## 2021-04-14 ENCOUNTER — Other Ambulatory Visit: Payer: Self-pay | Admitting: Surgery

## 2021-04-14 DIAGNOSIS — M19011 Primary osteoarthritis, right shoulder: Secondary | ICD-10-CM

## 2021-04-14 NOTE — Telephone Encounter (Signed)
Left message for pt on both numbers listed to schedule a sooner appt for pre op clearance. Pt is currently scheduled for his surgery 04/23/21, same day he is supposed to see Dr. Saunders Revel for a 6 month f/u. When I did call today, I was going to offer appt times for tomorrow for the pt. Left message to call in the ASAP to schedule a sooner appt at least for his pre op clearance. I will update the requesting office that we are going to try and get him in sooner for pre op clearance.

## 2021-04-14 NOTE — Telephone Encounter (Signed)
° °  Pre-operative Risk Assessment    Patient Name: Larry Allen  DOB: 1940/12/22 MRN: [id number]      Request for Surgical Clearance    Procedure:   Right total shoulder arthroplasty with biceps tenodesis   Date of Surgery:  Clearance 04/23/21                                 Surgeon:  Dr Elisabeth Pigeon Poggi  Surgeon's Group or Practice Name:  [facility]  Phone number:  [phone] Fax number:  [phone]   Type of Clearance Requested:   - Medical    Type of Anesthesia:  Not Indicated   Additional requests/questions:    SignedAce Gins   04/14/2021, 3:25 PM

## 2021-04-14 NOTE — Telephone Encounter (Signed)
Primary Cardiologist:Christopher End, MD  Chart reviewed as part of pre-operative protocol coverage. Because of Larry Allen's past medical history and time since last visit, he/she will require a follow-up visit in order to better assess preoperative cardiovascular risk.  Pre-op covering staff: - Please schedule appointment and call patient to inform them. - Please contact requesting surgeon's office via preferred method (i.e, phone, fax) to inform them of need for appointment prior to surgery.  If applicable, this message will also be routed to pharmacy pool and/or primary cardiologist for input on holding anticoagulant/antiplatelet agent as requested below so that this information is available at time of patient's appointment.   Deberah Pelton, NP  04/14/2021, 4:37 PM

## 2021-04-15 ENCOUNTER — Encounter: Payer: Self-pay | Admitting: Nurse Practitioner

## 2021-04-15 ENCOUNTER — Ambulatory Visit: Payer: Medicare HMO | Admitting: Nurse Practitioner

## 2021-04-15 ENCOUNTER — Other Ambulatory Visit: Payer: Self-pay

## 2021-04-15 VITALS — BP 130/88 | HR 75 | Ht 67.0 in | Wt 184.0 lb

## 2021-04-15 DIAGNOSIS — I10 Essential (primary) hypertension: Secondary | ICD-10-CM | POA: Diagnosis not present

## 2021-04-15 DIAGNOSIS — E782 Mixed hyperlipidemia: Secondary | ICD-10-CM | POA: Diagnosis not present

## 2021-04-15 DIAGNOSIS — I2510 Atherosclerotic heart disease of native coronary artery without angina pectoris: Secondary | ICD-10-CM

## 2021-04-15 DIAGNOSIS — Z01810 Encounter for preprocedural cardiovascular examination: Secondary | ICD-10-CM

## 2021-04-15 DIAGNOSIS — I5042 Chronic combined systolic (congestive) and diastolic (congestive) heart failure: Secondary | ICD-10-CM

## 2021-04-15 DIAGNOSIS — I428 Other cardiomyopathies: Secondary | ICD-10-CM | POA: Diagnosis not present

## 2021-04-15 NOTE — Patient Instructions (Signed)
Medication Instructions:  No changes at this time.  *If you need a refill on your cardiac medications before your next appointment, please call your pharmacy*   Lab Work: None  If you have labs (blood work) drawn today and your tests are completely normal, you will receive your results only by: [place] (if you have MyChart) OR A paper copy in the mail If you have any lab test that is abnormal or we need to change your treatment, we will call you to review the results.   Testing/Procedures: None   Follow-Up: At [facility], you and your health needs are our priority.  As part of our continuing mission to provide you with exceptional heart care, we have created designated Provider Care Teams.  These Care Teams include your primary Cardiologist (physician) and Advanced Practice Providers (APPs -  Physician Assistants and Nurse Practitioners) who all work together to provide you with the care you need, when you need it.   Your next appointment:   6 month(s)  The format for your next appointment:   In Person  Provider:   Nelva Bush, MD

## 2021-04-15 NOTE — Progress Notes (Signed)
Office Visit    Patient Name: Larry Allen Date of Encounter: 04/15/2021  Primary Care Provider:  Jefm Petty, MD Primary Cardiologist:  Nelva Bush, MD  Chief Complaint    81 year old male with a history of hypertension, hyperlipidemia, nonischemic cardiomyopathy/heart failure with midrange ejection fraction, nonobstructive CAD, PVCs, and prostate cancer, who presents for preoperative cardiovascular risk assessment prior to pending right total shoulder arthroplasty with biceps tenodesis.  Past Medical History    Past Medical History:  Diagnosis Date   Cancer ([place]) 2016   prostate cancer    Chronic combined systolic (congestive) and diastolic (congestive) heart failure ([place])    a. 02/2020 Echo: EF 40-45%; b. 07/2020 Echo: EF 45-50%, glob HK, GrI DD, nl RV fxn, mildly dil LA. Mod MR.   HLD (hyperlipidemia)    Hypertension    Moderate mitral regurgitation    a. 07/2020 Echo: Mod MR.   NICM (nonischemic cardiomyopathy) ([place])    a. 02/2020 Echo: EF 40-45%; b. 07/2020 Echo: EF 45-50%.   Nonobstructive CAD (coronary artery disease)    a. 02/2020 MV: inf infarct, no ischemia; b. 02/2020 Cath: LM nl, LAD nl, D1/2/3 nl, RI nl, LCX nl, OM1/2/ nl, RCA 50p, RPDA/RPAV, RPL1/2/3 nl. LVEDP 15-58mmHg-->Med rx.   Prostate cancer ([place])    Thrombocytopenia ([place])    Past Surgical History:  Procedure Laterality Date   APPENDECTOMY     CARDIAC CATHETERIZATION     HERNIA REPAIR  2020   JOINT REPLACEMENT  2020   LEFT HEART CATH AND CORONARY ANGIOGRAPHY N/A 03/12/2020   Procedure: LEFT HEART CATH AND CORONARY ANGIOGRAPHY;  Surgeon: Nelva Bush, MD;  Location: [place] [facility];  Service: Cardiovascular;  Laterality: N/A;   TOTAL SHOULDER ARTHROPLASTY Left 04/30/2020   Procedure: TOTAL SHOULDER ARTHROPLASTY;  Surgeon: Corky Mull, MD;  Location: [facility] ORS;  Service: Orthopedics;  Laterality: Left;    Allergies  Allergies  Allergen Reactions   Amoxicillin Swelling and Rash     History of Present Illness    81 year old male with above past medical history including hypertension, hyperlipidemia, nonischemic cardiomyopathy/heart failure with midrange ejection fraction, nonobstructive CAD, PVCs, and prostate cancer.  [name] was previously evaluated in December 2021 in the setting of abnormal EKG prior to undergoing left total shoulder arthroplasty.  He reported dyspnea on exertion and underwent echocardiography which showed an EF of 40 to 45% with global hypokinesis, grade 1 diastolic dysfunction, and mild to moderate mitral regurgitation.  He subsequently underwent a Lexiscan Myoview which showed a small defect of moderate severity in the apical inferior location suggestive of prior infarct.  Mild coronary calcifications, especially in the RCA, were noted.  Subsequent diagnostic catheterization revealed 50% proximal RCA stenosis and otherwise normal coronary arteries.  He has been managed with beta-blocker, ACE inhibitor, aspirin, statin, and as needed furosemide, with follow-up echo in May 2022, showing slight improvement in EF to 45 to 50%, global hypokinesis, grade 1 diastolic dysfunction, and moderate mitral regurgitation.  [name] was last seen in cardiology clinic in August 2022, at which time he was doing well.  Since then, he has remained active, playing golf fairly regularly, though his swing is now limited by right shoulder pain.  With the weather being somewhat wet, he has had to walk the golf course more frequently and is able to do so without experiencing chest pain or dyspnea.  He denies palpitations, PND, orthopnea, dizziness, syncope, edema, or early satiety.  He is hopeful to have right shoulder  surgery February 8.  Home Medications    Current Outpatient Medications  Medication Sig Dispense Refill   acetaminophen (TYLENOL) 500 MG tablet Take 500 mg by mouth every 6 (six) hours as needed for moderate pain or mild pain (for pain.).     aspirin EC 81  MG tablet Take 81 mg by mouth daily. Swallow whole.     calcium carbonate (OSCAL) 1500 (600 Ca) MG TABS tablet Take 600 mg by mouth daily.     carvedilol (COREG) 3.125 MG tablet TAKE 1 TABLET (3.125 MG TOTAL) BY MOUTH 2 (TWO) TIMES DAILY WITH A MEAL. 180 tablet 3   cetirizine (ZYRTEC) 10 MG tablet Take 10 mg by mouth at bedtime.     cholecalciferol (VITAMIN D) 25 MCG (1000 UNIT) tablet Take 1,000 Units by mouth daily.     Coenzyme Q10 (COQ10) 200 MG CAPS Take 200 mg by mouth at bedtime.     docusate sodium (COLACE) 50 MG capsule Take 50 mg by mouth daily.     fluticasone (FLONASE) 50 MCG/ACT nasal spray Place 1 spray into both nostrils 2 (two) times a week.     furosemide (LASIX) 20 MG tablet Take 1 tablet (20 mg total) by mouth daily as needed for fluid (as needed for weight gain of 2 pounds overnight or 5 pounds in one week.). Take for 3 consecutive days, then PRN 90 tablet 3   lisinopril-hydrochlorothiazide (ZESTORETIC) 20-12.5 MG tablet Take 1 tablet by mouth daily.     Multiple Vitamin (MULTIVITAMIN WITH MINERALS) TABS tablet Take 1 tablet by mouth daily. 50 +     OVER THE COUNTER MEDICATION Take 1-2 capsules by mouth See admin instructions. Balance of Carolynne Edouard take 2 Capsule in the morning and 1 capsule at bedtime     OVER THE COUNTER MEDICATION Take 1-2 capsules by mouth See admin instructions. Balance of Nature Fruit  Take 2 capsules in the morning and 1 capsule at bedtime     polyethylene glycol (MIRALAX / GLYCOLAX) 17 g packet Take 17 g by mouth daily.     Probiotic Product (PROBIOTIC DAILY) CAPS Take 1 capsule by mouth daily.     rosuvastatin (CRESTOR) 20 MG tablet Take 20 mg by mouth at bedtime.     vardenafil (LEVITRA) 20 MG tablet Take 20 mg by mouth as directed.     zolpidem (AMBIEN) 5 MG tablet Take 5 mg by mouth at bedtime as needed for sleep.     No current facility-administered medications for this visit.     Review of Systems    Doing well without symptoms or  limitations.  He denies chest pain, palpitations, dyspnea, pnd, orthopnea, n, v, dizziness, syncope, edema, weight gain, or early satiety.  All other systems reviewed and are otherwise negative except as noted above.    Physical Exam    VS:  BP 130/88 (BP Location: Left Arm, Patient Position: Sitting, Cuff Size: Normal)    Pulse 75    Ht 5\' 7"  (1.702 m)    Wt 184 lb (83.5 kg)    SpO2 98%    BMI 28.82 kg/m  , BMI Body mass index is 28.82 kg/m.     GEN: Well nourished, well developed, in no acute distress. HEENT: normal. Neck: Supple, no JVD, carotid bruits, or masses. Cardiac: RRR, no murmurs, rubs, or gallops. No clubbing, cyanosis, edema.  Radials/PT 2+ and equal bilaterally.  Respiratory:  Respirations regular and unlabored, clear to auscultation bilaterally. GI: Soft, nontender, nondistended, BS +  x 4. MS: no deformity or atrophy. Skin: warm and dry, no rash. Neuro:  Strength and sensation are intact. Psych: Normal affect.  Accessory Clinical Findings    ECG personally reviewed by me today -regular sinus rhythm, 75, left axis deviation, left anterior fascicular block, right bundle branch block (bifascicular block)- no acute changes.  Labs dated March 21, 2021 from care everywhere  Hemoglobin 13.8, hematocrit 41.6, WBC 4.2, platelets 133 Sodium 141, potassium 4.3, chloride 106, CO2 32, BUN 15, creatinine 0.64, glucose 82 Total protein 6.2, albumin 3.8, calcium 9.4 Total bilirubin 0.7, alkaline phosphatase 65, AST 18, ALT 14 Total cholesterol 106 triglycerides 55, LDL 61  Assessment & Plan    1.  Preoperative cardiovascular risk assessment: Patient with a history of nonobstructive CAD and nonischemic cardiomyopathy with an EF of 45 to 50% by echocardiogram in May 2022 presents for preoperative risk evaluation pending right shoulder surgery.  Preoperative cardiovascular risk of major cardiac event calculates to 6.6%, largely secondary to prior history of abnormal stress testing  (nonobstructive CAD on cath) and reduced ejection fraction.  Patient has good activity tolerance without experiencing chest pain or dyspnea (max achievable METS of 8).  No further ischemic evaluation is warranted at this time given prior evaluation in late 2021 in stable symptoms.  Recommend continuation of beta-blocker and statin therapy throughout the perioperative period.  With mild reduction in LV function, will need to watch volume status closely perioperatively.  2.  Chronic combined systolic and diastolic congestive heart failure/HFmrEF/NICM: Doing well and asymptomatic at home.  He is only use Lasix once or twice in the past year.  He is euvolemic on examination today.  Continue beta-blocker and ACE inhibitor therapy.  Watch volume status closely in the perioperative period.  3.  Nonobstructive CAD: Diagnostic catheterization in December 2021 with 50% proximal RCA stenosis.  He has not been experiencing any chest pain or dyspnea on exertion.  Continue medical therapy which includes aspirin, statin, and beta-blocker therapy.  Ideally, would prefer that he remain on aspirin throughout the perioperative period however if necessary, can hold for 5 days prior to surgery.  4.  Essential hypertension: Stable on beta-blocker and lisinopril-HCTZ.  5.  Hyperlipidemia: LDL 61 by labs earlier this month with normal LFTs.  Continue statin therapy.  6.  Disposition: Follow-up in cardiology clinic in 6 months or sooner if necessary.  Murray Hodgkins, NP 04/15/2021, 3:38 PM

## 2021-04-15 NOTE — Telephone Encounter (Signed)
Pt is scheduled to see Ignacia Bayley, NP, today, 04/15/2021, clearance will be addressed at that time.  Will route back to the requesting surgeon's office to make them aware.

## 2021-04-17 ENCOUNTER — Ambulatory Visit
Admission: RE | Admit: 2021-04-17 | Discharge: 2021-04-17 | Disposition: A | Discharge status: Home / Self Care | Payer: Medicare HMO | Source: Ambulatory Visit | Attending: Surgery | Admitting: Surgery

## 2021-04-17 DIAGNOSIS — M25511 Pain in right shoulder: Secondary | ICD-10-CM | POA: Diagnosis not present

## 2021-04-17 DIAGNOSIS — M19011 Primary osteoarthritis, right shoulder: Secondary | ICD-10-CM

## 2021-04-18 ENCOUNTER — Other Ambulatory Visit: Payer: Self-pay | Admitting: Surgery

## 2021-04-23 ENCOUNTER — Ambulatory Visit: Payer: Medicare HMO | Admitting: Internal Medicine

## 2021-04-25 ENCOUNTER — Encounter
Admission: RE | Admit: 2021-04-25 | Discharge: 2021-04-25 | Disposition: A | Discharge status: Home / Self Care | Payer: Medicare HMO | Source: Ambulatory Visit | Attending: Surgery | Admitting: Surgery

## 2021-04-25 ENCOUNTER — Other Ambulatory Visit: Payer: Self-pay

## 2021-04-25 VITALS — BP 134/67 | HR 62 | Resp 16 | Ht 67.0 in | Wt 182.8 lb

## 2021-04-25 DIAGNOSIS — Z01818 Encounter for other preprocedural examination: Secondary | ICD-10-CM | POA: Insufficient documentation

## 2021-04-25 DIAGNOSIS — Z880 Allergy status to penicillin: Secondary | ICD-10-CM | POA: Diagnosis not present

## 2021-04-25 DIAGNOSIS — M19011 Primary osteoarthritis, right shoulder: Secondary | ICD-10-CM | POA: Diagnosis not present

## 2021-04-25 DIAGNOSIS — Z01812 Encounter for preprocedural laboratory examination: Secondary | ICD-10-CM

## 2021-04-25 HISTORY — DX: Pneumonia, unspecified organism: J18.9

## 2021-04-25 HISTORY — DX: Unspecified osteoarthritis, unspecified site: M19.90

## 2021-04-25 LAB — COMPREHENSIVE METABOLIC PANEL
ALT: 17 U/L (ref 0–44)
AST: 20 U/L (ref 15–41)
Albumin: 3.7 g/dL (ref 3.5–5.0)
Alkaline Phosphatase: 64 U/L (ref 38–126)
Anion gap: 8 (ref 5–15)
BUN: 18 mg/dL (ref 8–23)
CO2: 30 mmol/L (ref 22–32)
Calcium: 9.6 mg/dL (ref 8.9–10.3)
Chloride: 103 mmol/L (ref 98–111)
Creatinine, Ser: 0.65 mg/dL (ref 0.61–1.24)
GFR, Estimated: >60 mL/min (ref >60)
Glucose, Bld: 94 mg/dL (ref 70–99)
Potassium: 3.6 mmol/L (ref 3.5–5.1)
Sodium: 141 mmol/L (ref 135–145)
Total Bilirubin: 0.5 mg/dL (ref 0.3–1.2)
Total Protein: 6.8 g/dL (ref 6.5–8.1)

## 2021-04-25 LAB — URINALYSIS, ROUTINE W REFLEX MICROSCOPIC
Bilirubin Urine: NEGATIVE
Glucose, UA: NEGATIVE mg/dL
Hgb urine dipstick: NEGATIVE
Ketones, ur: NEGATIVE mg/dL
Leukocytes,Ua: NEGATIVE
Nitrite: NEGATIVE
Protein, ur: NEGATIVE mg/dL
Specific Gravity, Urine: 1.005 (ref 1.005–1.030)
pH: 6 (ref 5.0–8.0)

## 2021-04-25 LAB — CBC WITH DIFFERENTIAL/PLATELET
Abs Immature Granulocytes: 0.01 10*3/uL (ref 0.00–0.07)
Basophils Absolute: 0 10*3/uL (ref 0.0–0.1)
Basophils Relative: 1 %
Eosinophils Absolute: 0.3 10*3/uL (ref 0.0–0.5)
Eosinophils Relative: 5 %
HCT: 41.9 % (ref 39.0–52.0)
Hemoglobin: 13.8 g/dL (ref 13.0–17.0)
Immature Granulocytes: 0 %
Lymphocytes Relative: 28 %
Lymphs Abs: 1.7 10*3/uL (ref 0.7–4.0)
MCH: 31.3 pg (ref 26.0–34.0)
MCHC: 32.9 g/dL (ref 30.0–36.0)
MCV: 95 fL (ref 80.0–100.0)
Monocytes Absolute: 0.6 10*3/uL (ref 0.1–1.0)
Monocytes Relative: 10 %
Neutro Abs: 3.4 10*3/uL (ref 1.7–7.7)
Neutrophils Relative %: 56 %
Platelets: 157 10*3/uL (ref 150–400)
RBC: 4.41 MIL/uL (ref 4.22–5.81)
RDW: 13.2 % (ref 11.5–15.5)
WBC: 6 10*3/uL (ref 4.0–10.5)
nRBC: 0 % (ref 0.0–0.2)

## 2021-04-25 LAB — SURGICAL PCR SCREEN
MRSA, PCR: NEGATIVE
Staphylococcus aureus: NEGATIVE

## 2021-04-25 NOTE — Progress Notes (Signed)
Perioperative Services Pre-Admission/Anesthesia Testing   Date: 04/25/21 Name: Larry Allen MRN:   [id number]  Re: Consideration of preoperative prophylactic antibiotic change   Request sent to: Poggi, Marshall Cork, MD (routed and/or faxed via [facility])  Planned Surgical Procedure(s):    Case: [id number] Date/Time: 05/08/21 0715   Procedure: Right total shoulder arthroplasty with Biceps tenodesis vs Reverse total shoulder (Right: Shoulder)   Anesthesia type: Choice   Pre-op diagnosis: Primary osteoarthritis, right shoulder  M19.011   Location: [facility] OR ROOM 03 / [place] [facility] FOR ANESTHESIA GROUP   Surgeons: Corky Mull, MD   Clinical Notes:  Patient has a documented allergy to PCN  Advising that amoxicillin has caused him to experience low severity rash and swelling in the past.    Received cephalosporin with no documented complications CEFAZOLIN on 01/16/2019, 03/03/2019, 04/30/2020  Screened as appropriate for cephalosporin use during medication reconciliation No immediate angioedema, dysphagia, SOB, anaphylaxis symptoms. No severe rash involving mucous membranes or skin necrosis. No hospital admissions related to side effects of PCN/cephalosporin use.  No documented reaction to PCN or cephalosporin in the last 10 years.  Request:  As an evidence based approach to reducing the rate of incidence for post-operative SSI and the development of MDROs, could an agent with narrower coverage for preoperative prophylaxis in this patient's upcoming surgical course be considered?   Currently ordered preoperative prophylactic ABX: clindamycin.   Specifically requesting change to cephalosporin (CEFAZOLIN).   Please communicate decision with me and I will change the orders in Epic as per your direction.   Things to consider: Many patients report that they were "allergic" to PCN earlier in life, however this does not translate into a true lifelong allergy. Patients can lose sensitivity to specific IgE  antibodies over time if PCN is avoided (Kleris & Lugar, 2019).  Up to 10% of the adult population and 15% of hospitalized patients report an allergy to PCN, however clinical studies suggest that 90% of those reporting an allergy can tolerate PCN antibiotics (Kleris & Lugar, 2019).  Cross-sensitivity between PCN and cephalosporins has been documented as being as high as 10%, however this estimation included data believed to have been collected in a setting where there was contamination. Newer data suggests that the prevalence of cross-sensitivity between PCN and cephalosporins is actually estimated to be closer to 1% (Hermanides et al., 2018).   Patients labeled as PCN allergic, whether they are truly allergic or not, have been found to have inferior outcomes in terms of rates of serious infection, and these patients tend to have longer hospital stays (Perla, 2019).  Treatment related secondary infections, such as Clostridioides difficile, have been linked to the improper use of broad spectrum antibiotics in patients improperly labeled as PCN allergic (Kleris & Lugar, 2019).  Anaphylaxis from cephalosporins is rare and the evidence suggests that there is no increased risk of an anaphylactic type reaction when cephalosporins are used in a PCN allergic patient (Pichichero, 2006).  Citations: Hermanides J, Lemkes BA, Prins Pearla Dubonnet MW, Terreehorst I. Presumed ?-Lactam Allergy and Cross-reactivity in the Operating Theater: A Practical Approach. Anesthesiology. 2018 Aug;129(2):335-342. doi: 10.1097/ALN.0000000000002252. PMID: 08657846.  Kleris, [place]., & Lugar, P. L. (2019). Things We Do For No Reason: Failing to Question a Penicillin Allergy History. Journal of hospital medicine, 14(10), [phone]. Advance online publication. https://www.wallace-middleton.info/  Pichichero, M. E. (2006). Cephalosporins can be prescribed safely for penicillin-allergic patients. Journal of family medicine, 55(2),  106-112. Accessed: https://cdn.mdedge.com/files/s33fs-public/Document/September-2017/5502JFP_AppliedEvidence1.pdf   Honor Loh, MSN, APRN,  FNP-C, CEN [facility]  Peri-operative Services Nurse Practitioner FAX: [phone] 04/25/21 4:01 PM

## 2021-04-25 NOTE — Patient Instructions (Addendum)
Your procedure is scheduled on: 05/08/21 - Thursday Report to the Registration Desk on the 1st floor of the [facility]. To find out your arrival time, please call [phone] between 1PM - 3PM on: 05/07/21 - Wednesday Report to [place] center on 05/06/21 for Covid Test.  REMEMBER: Instructions that are not followed completely may result in serious medical risk, up to and including death; or upon the discretion of your surgeon and anesthesiologist your surgery may need to be rescheduled.  Do not eat food after midnight the night before surgery.  No gum chewing, lozengers or hard candies.  You may however, drink CLEAR liquids up to 2 hours before you are scheduled to arrive for your surgery. Do not drink anything within 2 hours of your scheduled arrival time.  Clear liquids include: - water  - apple juice without pulp - gatorade (not RED, PURPLE, OR BLUE) - black coffee or tea (Do NOT add milk or creamers to the coffee or tea) Do NOT drink anything that is not on this list.  In addition, your doctor has ordered for you to drink the provided  Ensure Pre-Surgery Clear Carbohydrate Drink  Drinking this carbohydrate drink up to two hours before surgery helps to reduce insulin resistance and improve patient outcomes. Please complete drinking 2 hours prior to scheduled arrival time.  TAKE THESE MEDICATIONS THE MORNING OF SURGERY WITH A SIP OF WATER:  - carvedilol (COREG) 3.125 MG tablet - fluticasone (FLONASE) 50 MCG/ACT nasal spray  Follow recommendations from Cardiologist, Pulmonologist or PCP regarding stopping Aspirin, Coumadin, Plavix, Eliquis, Pradaxa, or Pletal.  One week prior to surgery: Stop Anti-inflammatories (NSAIDS) such as Advil, Aleve, Ibuprofen, Motrin, Naproxen, Naprosyn and Aspirin based products such as Excedrin, Goodys Powder, BC Powder.  Stop ANY OVER THE COUNTER supplements until after surgery.  You may however, continue to take Tylenol if needed for pain  up until the day of surgery.  No Alcohol for 24 hours before or after surgery.  No Smoking including e-cigarettes for 24 hours prior to surgery.  No chewable tobacco products for at least 6 hours prior to surgery.  No nicotine patches on the day of surgery.  Do not use any "recreational" drugs for at least a week prior to your surgery.  Please be advised that the combination of cocaine and anesthesia may have negative outcomes, up to and including death. If you test positive for cocaine, your surgery will be cancelled.  On the morning of surgery brush your teeth with toothpaste and water, you may rinse your mouth with mouthwash if you wish. Do not swallow any toothpaste or mouthwash.  Use CHG Soap or wipes as directed on instruction sheet.  Do not wear jewelry, make-up, hairpins, clips or nail polish.  Do not wear lotions, powders, or perfumes.   Do not shave body from the neck down 48 hours prior to surgery just in case you cut yourself which could leave a site for infection.  Also, freshly shaved skin may become irritated if using the CHG soap.  Contact lenses, hearing aids and dentures may not be worn into surgery.  Do not bring valuables to the hospital. [facility] is not responsible for any missing/lost belongings or valuables.   Notify your doctor if there is any change in your medical condition (cold, fever, infection).  Wear comfortable clothing (specific to your surgery type) to the hospital.  After surgery, you can help prevent lung complications by doing breathing exercises.  Take deep breaths and  cough every 1-2 hours. Your doctor may order a device called an Incentive Spirometer to help you take deep breaths. When coughing or sneezing, hold a pillow firmly against your incision with both hands. This is called splinting. Doing this helps protect your incision. It also decreases belly discomfort.  If you are being admitted to the hospital overnight, leave your  suitcase in the car. After surgery it may be brought to your room.  If you are being discharged the day of surgery, you will not be allowed to drive home. You will need a responsible adult (18 years or older) to drive you home and stay with you that night.   If you are taking public transportation, you will need to have a responsible adult (18 years or older) with you. Please confirm with your physician that it is acceptable to use public transportation.   Please call the [place] Dept. at [phone] if you have any questions about these instructions.  Surgery Visitation Policy:  Patients undergoing a surgery or procedure may have one family member or support person with them as long as that person is not COVID-19 positive or experiencing its symptoms.  That person may remain in the waiting area during the procedure and may rotate out with other people.  Inpatient Visitation:    Visiting hours are 7 a.m. to 8 p.m. Up to two visitors ages 16+ are allowed at one time in a patient room. The visitors may rotate out with other people during the day. Visitors must check out when they leave, or other visitors will not be allowed. One designated support person may remain overnight. The visitor must pass COVID-19 screenings, use hand sanitizer when entering and exiting the patients room and wear a mask at all times, including in the patients room. Patients must also wear a mask when staff or their visitor are in the room. Masking is required regardless of vaccination status.

## 2021-04-27 LAB — TYPE AND SCREEN
ABO/RH(D): A NEG
Antibody Screen: NEGATIVE

## 2021-04-28 NOTE — Progress Notes (Signed)
°  Perioperative Services Pre-Admission/Anesthesia Testing     Date: 04/28/21  Name: Larry Allen MRN:   [id number]  Re: Change in [place] for upcoming surgery   Case: [id number] Date/Time: 05/08/21 0715   Procedures:      REVERSE SHOULDER ARTHROPLASTY (Right: Shoulder)     BICEPS TENODESIS (Right: Shoulder)   Anesthesia type: Choice   Pre-op diagnosis: Primary osteoarthritis, right shoulder  M19.011   Location: [facility] OR ROOM 03 / [place] [facility] FOR ANESTHESIA GROUP   Surgeons: Corky Mull, MD   Primary attending surgeon was consulted regarding consideration of therapeutic change in antimicrobial agent being used for preoperative prophylaxis in this patient's upcoming surgical case. Following analysis of the risk versus benefits, the patient's primary attending surgeon advised that it would be acceptable to discontinue the ordered clindamycin and place an order for cefazolin 2 gm IV on call to the OR. Orders for this patient were amended by me following collaborative conversation with attending surgeon taking into consideration of risk versus benefits associated with the change in therapy.  Honor Loh, MSN, APRN, FNP-C, CEN [facility]  Peri-operative Services Nurse Practitioner Phone: [phone] 04/28/21 5:19 PM

## 2021-05-02 ENCOUNTER — Encounter: Payer: Self-pay | Admitting: Surgery

## 2021-05-02 NOTE — Progress Notes (Signed)
Perioperative Services  Pre-Admission/Anesthesia Testing Clinical Review  Date: 05/02/21  Patient Demographics:  Name: Larry Allen DOB:   04/22/1940 MRN:   [id number]  Planned Surgical Procedure(s):    Case: [id number] Date/Time: 05/08/21 0715   Procedures:      REVERSE SHOULDER ARTHROPLASTY (Right: Shoulder)     BICEPS TENODESIS (Right: Shoulder)   Anesthesia type: Choice   Pre-op diagnosis: Primary osteoarthritis, right shoulder  M19.011   Location: [facility] OR ROOM 03 / [place] [facility] FOR ANESTHESIA [facility]   Surgeons: Corky Mull, MD   NOTE: Available PAT nursing documentation and vital signs have been reviewed. Clinical nursing staff has updated patient's PMH/PSHx, current medication list, and drug allergies/intolerances to ensure comprehensive history available to assist in medical decision making as it pertains to the aforementioned surgical procedure and anticipated anesthetic course. Extensive review of available clinical information performed. Larry Allen PMH and PSHx updated with any diagnoses/procedures that  may have been inadvertently omitted during his intake with the pre-admission testing department's nursing staff.  Clinical Discussion:  Larry Allen is a 81 y.o. male who is submitted for pre-surgical anesthesia review and clearance prior to him undergoing the above procedure. Patient has never been a smoker. Pertinent PMH includes: CAD, systolic/diastolic heart failure, NICM, bifascicular block (RBBB + LAFB), MV regurgitation, HTN, HLD, DOE, OA, thrombocytopenia, insomnia.   Patient is followed by cardiology (End, MD). He was last seen in the cardiology clinic on 04/15/2021; notes reviewed. At the time of his clinic visit, the patient denied any chest pain, shortness of breath, PND, orthopnea, palpitations, significant peripheral edema, vertiginous symptoms, or presyncope/syncope.  Patient with a PMH significant for cardiovascular diagnoses.  TTE performed on 02/29/2020 revealed  a mildly reduced left ventricular systolic function with an estimated EF of 40-45%.  There was global hypokinesis.  Diastolic Doppler parameters consistent with impaired relaxation (G1DD).  Mildly elevated PASP of 37.5 mmHg.  Mild to moderate mitral valve regurgitation.  No evidence of valvular stenosis.  Myocardial perfusion imaging study performed on 03/04/2020 revealed a mildly decreased left ventricular ejection fraction of 45-54%.  There was no ST segment deviation noted during stress.  ECG not interpretable due to IVCD.  There were frequent PVCs noted.  There was a small perfusion defect of moderate severity in the apical inferior region suggestive of prior small infarct.  CT attenuation images shows mild coronary artery calcifications in the RCA distribution.  Study determined to be intermediate risk.  Diagnostic left heart catheterization performed on 03/12/2020 revealed moderate nonobstructive CAD with a 50% stenosis of the proximal RCA.  There was no significant disease noted in the LAD or LCx.  Left ventricular filling pressures mildly elevated at 15-20 mmHg.  Intervention deferred opting for medical management to prevent progression of CAD and optimize management of NICM.  Last TTE was performed on 07/30/2020 revealing a mildly decreased left ventricular systolic function with an estimated EF of 45-50%.  There was global hypokinesis.  Left ventricular diastolic Doppler parameters consistent with impaired relaxation (G1DD).  Left atrial pressure elevated.  Right ventricle mildly enlarged.  Left atrium mildly dilated.  There was moderate mitral valve regurgitation.  There was no evidence of a significant transvalvular gradient to suggest valvular stenosis.  Blood pressure reasonably controlled at 130/88 on currently prescribed beta-blocker, diuretic, and ACEi therapies.  Patient is on a statin for his HLD.  He is not diabetic.  Patient is on a PDE5i medication (vardenafil) for his erectile  dysfunction diagnosis.  Patient's activity is somewhat  limited by his arthritides, however he is felt to be able to achieve at least >/= 4 METS of activity without angina/anginal equivalent symptoms.  ECG in the office revealed sinus rhythm with a bifascicular block at a rate of 75 bpm; no acute changes noted.  No changes were made to his medication regimen.  Patient to follow-up with outpatient cardiology in 6 months or sooner if needed.  Larry Allen is scheduled for an elective RIGHT REVERSE SHOULDER ARTHROPLASTY AND BICEPS TENODESIS on 05/08/2021 with Dr. Milagros Evener, MD.  Given patient's past medical history significant for cardiovascular diagnoses, presurgical cardiac clearance was sought by the PAT team.  Per cardiology, "perioperative cardiovascular risk of MACE calculates to 6.6%, largely secondary to prior history of abnormal stress testing (nonobstructive CAD on cath) and reduced ejection fraction.  Patient has good activity tolerance without experiencing chest pain or dyspnea; maximum achievable METS of 8.  No further ischemic evaluation is warranted at this time given prior evaluation in late 2021 and stable symptoms.  Recommend continuation of beta-blocker and statin therapy throughout the perioperative period.  With mild reduction in LV function.  We will need to watch volume status closely perioperatively.  Patient is at an overall ACCEPTABLE risk to proceed".  In review of his medication reconciliation, it is noted the patient is on daily antiplatelet therapy.  Cardiology has requested that patient continue his daily low-dose ASA throughout the perioperative course.  Patient denies previous perioperative complications with anesthesia in the past. In review of the available records, it is noted that patient underwent a general + regional anesthetic course here (ASA III) in 04/2020 without documented complications.   Vitals with BMI 04/25/2021 04/15/2021 10/16/2020  Height 5\' 7"  5\' 7"  5\' 6"    Weight 182 lbs 12 oz 184 lbs 189 lbs  BMI 28.62 00.93 81.82  Systolic 993 716 967  Diastolic 67 88 60  Pulse 62 75 78    Providers/Specialists:   NOTE: Primary physician provider listed below. Patient may have been seen by APP or partner within same practice.   PROVIDER ROLE / SPECIALTY LAST OV  Poggi, Marshall Cork, MD Orthopedics (Surgeon) 04/11/2021  Jefm Petty, MD Primary Care Provider 03/26/2021  Nelva Bush, MD Cardiology 04/15/2021   Allergies:  Amoxicillin  Current Home Medications:   No current facility-administered medications for this encounter.    acetaminophen (TYLENOL) 500 MG tablet   aspirin EC 81 MG tablet   calcium carbonate (OSCAL) 1500 (600 Ca) MG TABS tablet   carvedilol (COREG) 3.125 MG tablet   cetirizine (ZYRTEC) 10 MG tablet   cholecalciferol (VITAMIN D) 25 MCG (1000 UNIT) tablet   Coenzyme Q10 (COQ10) 200 MG CAPS   docusate sodium (COLACE) 50 MG capsule   fluticasone (FLONASE) 50 MCG/ACT nasal spray   furosemide (LASIX) 20 MG tablet   lisinopril-hydrochlorothiazide (ZESTORETIC) 20-12.5 MG tablet   Multiple Vitamin (MULTIVITAMIN WITH MINERALS) TABS tablet   OVER THE COUNTER MEDICATION   OVER THE COUNTER MEDICATION   polyethylene glycol (MIRALAX / GLYCOLAX) 17 g packet   Probiotic Product (PROBIOTIC DAILY) CAPS   rosuvastatin (CRESTOR) 20 MG tablet   vardenafil (LEVITRA) 20 MG tablet   zolpidem (AMBIEN) 5 MG tablet   History:   Past Medical History:  Diagnosis Date   Arthritis    Chronic combined systolic (congestive) and diastolic (congestive) heart failure (HCC)    a.) TTE: 02/29/2020: EF 40-45%; global HK; PASP 37.5 mmHg; mild-mod MR; G1DD.  b.) TTE 07/30/2020: EF 45-50%; global HK; G1DD;  nl RV fxn; mildly dil LA; mod MR.   DOE (dyspnea on exertion)    Erectile dysfunction    a.) on PDE5i medication (vardenafil)   HLD (hyperlipidemia)    Hypertension    Insomnia    a.) on zolpidem   Mitral valve regurgitation    a.) TTE  02/29/2020: EF 40-45%; mild-mod MR. b.) TTE 07/30/2020: EF 45-50%; mod MR   NICM (nonischemic cardiomyopathy) ([place])    a. 02/2020 Echo: EF 40-45%; b. 07/2020 Echo: EF 45-50%.   Nonobstructive CAD (coronary artery disease)    a. 02/2020 MV: inf infarct, no ischemia; b. 02/2020 Cath: LM nl, LAD nl, D1/2/3 nl, RI nl, LCX nl, OM1/2/ nl, RCA 50p, RPDA/RPAV, RPL1/2/3 nl. LVEDP 15-33mmHg-->Med rx.   Pneumonia    Prostate cancer ([place]) 2016   Right bundle branch block (RBBB) with left anterior fascicular block (LAFB) 10/16/2020   Thrombocytopenia ([place])    Past Surgical History:  Procedure Laterality Date   APPENDECTOMY     CARDIAC CATHETERIZATION     COLONOSCOPY W/ POLYPECTOMY     HERNIA REPAIR  2020   JOINT REPLACEMENT Left 2020   knee   LEFT HEART CATH AND CORONARY ANGIOGRAPHY N/A 03/12/2020   Procedure: LEFT HEART CATH AND CORONARY ANGIOGRAPHY;  Surgeon: Nelva Bush, MD;  Location: [facility];  Service: Cardiovascular;  Laterality: N/A;   TOTAL SHOULDER ARTHROPLASTY Left 04/30/2020   Procedure: TOTAL SHOULDER ARTHROPLASTY;  Surgeon: Corky Mull, MD;  Location: [facility] ORS;  Service: Orthopedics;  Laterality: Left;   Family History  Problem Relation Age of Onset   Heart Problems Mother    Heart Problems Sister    Heart attack Brother    Heart Problems Brother    Dementia Brother    Heart Problems Brother    Social History   Tobacco Use   Smoking status: Never   Smokeless tobacco: Never  Vaping Use   Vaping Use: Never used  Substance Use Topics   Alcohol use: Never   Drug use: Never    Pertinent Clinical Results:  LABS: Labs reviewed: Acceptable for surgery.  No visits with results within 3 Day(s) from this visit.  Latest known visit with results is:  Hospital Outpatient Visit on 04/25/2021  Component Date Value Ref Range Status   MRSA, PCR 04/25/2021 NEGATIVE  NEGATIVE Final   Staphylococcus aureus 04/25/2021 NEGATIVE  NEGATIVE Final   Comment: (NOTE) The  Xpert SA Assay (FDA approved for NASAL specimens in patients 74 years of age and older), is one component of a comprehensive surveillance program. It is not intended to diagnose infection nor to guide or monitor treatment. Performed at [facility], [place], [place] [address]    WBC 04/25/2021 6.0  4.0 - 10.5 K/uL Final   RBC 04/25/2021 4.41  4.22 - 5.81 MIL/uL Final   Hemoglobin 04/25/2021 13.8  13.0 - 17.0 g/dL Final   HCT 04/25/2021 41.9  39.0 - 52.0 % Final   MCV 04/25/2021 95.0  80.0 - 100.0 fL Final   MCH 04/25/2021 31.3  26.0 - 34.0 pg Final   MCHC 04/25/2021 32.9  30.0 - 36.0 g/dL Final   RDW 04/25/2021 13.2  11.5 - 15.5 % Final   Platelets 04/25/2021 157  150 - 400 K/uL Final   nRBC 04/25/2021 0.0  0.0 - 0.2 % Final   Neutrophils Relative % 04/25/2021 56  % Final   Neutro Abs 04/25/2021 3.4  1.7 - 7.7 K/uL Final  Lymphocytes Relative 04/25/2021 28  % Final   Lymphs Abs 04/25/2021 1.7  0.7 - 4.0 K/uL Final   Monocytes Relative 04/25/2021 10  % Final   Monocytes Absolute 04/25/2021 0.6  0.1 - 1.0 K/uL Final   Eosinophils Relative 04/25/2021 5  % Final   Eosinophils Absolute 04/25/2021 0.3  0.0 - 0.5 K/uL Final   Basophils Relative 04/25/2021 1  % Final   Basophils Absolute 04/25/2021 0.0  0.0 - 0.1 K/uL Final   Immature Granulocytes 04/25/2021 0  % Final   Abs Immature Granulocytes 04/25/2021 0.01  0.00 - 0.07 K/uL Final   Performed at [facility], [place], [place] [address]   Sodium 04/25/2021 141  135 - 145 mmol/L Final   Potassium 04/25/2021 3.6  3.5 - 5.1 mmol/L Final   Chloride 04/25/2021 103  98 - 111 mmol/L Final   CO2 04/25/2021 30  22 - 32 mmol/L Final   Glucose, Bld 04/25/2021 94  70 - 99 mg/dL Final   Glucose reference range applies only to samples taken after fasting for at least 8 hours.   BUN 04/25/2021 18  8 - 23 mg/dL Final   Creatinine, Ser 04/25/2021 0.65  0.61 - 1.24 mg/dL Final   Calcium 04/25/2021  9.6  8.9 - 10.3 mg/dL Final   Total Protein 04/25/2021 6.8  6.5 - 8.1 g/dL Final   Albumin 04/25/2021 3.7  3.5 - 5.0 g/dL Final   AST 04/25/2021 20  15 - 41 U/L Final   ALT 04/25/2021 17  0 - 44 U/L Final   Alkaline Phosphatase 04/25/2021 64  38 - 126 U/L Final   Total Bilirubin 04/25/2021 0.5  0.3 - 1.2 mg/dL Final   GFR, Estimated 04/25/2021 >60  >60 mL/min Final   Comment: (NOTE) Calculated using the CKD-EPI Creatinine Equation (2021)    Anion gap 04/25/2021 8  5 - 15 Final   Performed at [facility], [place], [place] [address]   Color, Urine 04/25/2021 STRAW (A)  YELLOW Final   APPearance 04/25/2021 CLEAR (A)  CLEAR Final   Specific Gravity, Urine 04/25/2021 1.005  1.005 - 1.030 Final   pH 04/25/2021 6.0  5.0 - 8.0 Final   Glucose, UA 04/25/2021 NEGATIVE  NEGATIVE mg/dL Final   Hgb urine dipstick 04/25/2021 NEGATIVE  NEGATIVE Final   Bilirubin Urine 04/25/2021 NEGATIVE  NEGATIVE Final   Ketones, ur 04/25/2021 NEGATIVE  NEGATIVE mg/dL Final   Protein, ur 04/25/2021 NEGATIVE  NEGATIVE mg/dL Final   Nitrite 04/25/2021 NEGATIVE  NEGATIVE Final   Leukocytes,Ua 04/25/2021 NEGATIVE  NEGATIVE Final   Performed at [facility], [address] [place], [place] [address]   ABO/RH(D) 04/25/2021 A NEG   Final   Antibody Screen 04/25/2021 NEG   Final   Sample Expiration 04/25/2021 05/09/2021,2359   Final   Extend sample reason 04/25/2021    Final                   Value:NO TRANSFUSIONS OR PREGNANCY IN THE PAST 3 MONTHS Performed at [facility], [address] [place], [place] [address]     ECG: Date: 04/15/2021 Time ECG obtained: 1508 PM Rate: 75 bpm Rhythm:  NSR; RBBB + LAFB Axis (leads I and aVF): Left axis deviation Intervals: PR 198 ms. QRS 126 ms. QTc 446 ms. ST segment and T wave changes: No evidence of acute ST segment elevation or depression. Evidence of an age undetermined septal infarct present.  Comparison: Similar to  previous tracing obtained on 10/16/2020   IMAGING / PROCEDURES: MRI SHOULDER RIGHT WO CONTRAST performed on 04/17/2021 Severe tendinosis of the supraspinatus tendon. Mild tendinosis of the infraspinatus tendon  Moderate tendinosis of the subscapularis tendon. Severe tendinosis of the intra-articular portion of the long head of the biceps tendon. Multiple loose bodies in the bicipital tendon sheath with the largest measuring 12 mm. Severe osteoarthritis of the right glenohumeral joint.  TRANSTHORACIC ECHOCARDIOGRAM performed on 07/30/2020 Left ventricular ejection fraction, by estimation, is 45 to 50%. The left ventricle has mildly decreased function. The left ventricle demonstrates global hypokinesis. Left ventricular diastolic parameters are consistent with Grade I diastolic dysfunction (impaired relaxation). Elevated left atrial pressure.  Right ventricular systolic function is normal. The right ventricular size is mildly enlarged.  Left atrial size was mildly dilated.  The mitral valve is normal in structure. Moderate mitral valve regurgitation. No evidence of mitral stenosis.  The aortic valve is tricuspid. Aortic valve regurgitation is not visualized. No aortic stenosis is present.  The pulmonic valve was abnormal.   LEFT HEART CATHETERIZATION AND CORONARY ANGIOGRAPHY performed on 03/12/2020 Moderate, non-obstructive coronary artery disease with 50% proximal RCA stenosis.   No significant disease noted in the LAD and LCx. Mildly elevated left ventricular filling pressure; LVEDP 15-20 mmHg.  Recommendations Medical therapy and risk factor modification to prevent progression of coronary artery disease. Optimize goal-directed medical therapy for management of non-ischemic cardiomyopathy.   MYOCARDIAL PERFUSION IMAGING STUDY (LEXISCAN) performed on 03/04/2020 LVEF mildly decreased at 45-54% No ST segment deviation noted during stress EKG is not interpretable due to IVCD Frequent  PVCs There is a small defect of moderate severity present in the apical inferior region.  This is suggestive of a prior small infarct. CT attenuation images showed mild coronary artery calcifications especially in the RCA distribution This is an intermediate risk study.  Impression and Plan:  Larry Allen has been referred for pre-anesthesia review and clearance prior to him undergoing the planned anesthetic and procedural courses. Available labs, pertinent testing, and imaging results were personally reviewed by me. This patient has been appropriately cleared by cardiology with an overall ACCEPTABLE risk of significant perioperative cardiovascular complications.  Based on clinical review performed today (05/02/21), barring any significant acute changes in the patient's overall condition, it is anticipated that he will be able to proceed with the planned surgical intervention. Any acute changes in clinical condition may necessitate his procedure being postponed and/or cancelled. Patient will meet with anesthesia team (MD and/or CRNA) on the day of his procedure for preoperative evaluation/assessment. Questions regarding anesthetic course will be fielded at that time.   Pre-surgical instructions were reviewed with the patient during his PAT appointment and questions were fielded by PAT clinical staff. Patient was advised that if any questions or concerns arise prior to his procedure then he should return a call to PAT and/or his surgeon's office to discuss.  Honor Loh, MSN, APRN, FNP-C, CEN [facility]  Peri-operative Services Nurse Practitioner Phone: [phone] Fax: [phone] 05/02/21 12:57 PM  NOTE: This note has been prepared using Dragon dictation software. Despite my best ability to proofread, there is always the potential that unintentional transcriptional errors may still occur from this process.

## 2021-05-06 ENCOUNTER — Other Ambulatory Visit: Payer: Self-pay

## 2021-05-06 ENCOUNTER — Other Ambulatory Visit
Admission: RE | Admit: 2021-05-06 | Discharge: 2021-05-06 | Disposition: A | Discharge status: Home / Self Care | Payer: Medicare HMO | Source: Ambulatory Visit | Attending: Surgery | Admitting: Surgery

## 2021-05-06 DIAGNOSIS — Z20822 Contact with and (suspected) exposure to covid-19: Secondary | ICD-10-CM | POA: Insufficient documentation

## 2021-05-06 DIAGNOSIS — Z01812 Encounter for preprocedural laboratory examination: Secondary | ICD-10-CM | POA: Insufficient documentation

## 2021-05-06 LAB — SARS CORONAVIRUS 2 (TAT 6-24 HRS): SARS Coronavirus 2: NEGATIVE

## 2021-05-07 NOTE — Anesthesia Preprocedure Evaluation (Addendum)
Anesthesia Evaluation  Patient identified by MRN, date of birth, ID band Patient awake    Reviewed: Allergy & Precautions, H&P , NPO status , Patient's Chart, lab work & pertinent test results  Airway Mallampati: II       Dental  (+) Partial Lower, Upper Dentures   Pulmonary neg pulmonary ROS,    Pulmonary exam normal breath sounds clear to auscultation       Cardiovascular Exercise Tolerance: Good METS (>4): hypertension, + CAD and +CHF  Normal cardiovascular exam+ dysrhythmias (Right bundle branch block (RBBB) with left anterior fascicular block (LAFB))  Rhythm:Regular Rate:Normal  02/2020 Cath: LM nl, LAD nl, D1/2/3 nl, RI nl, LCX nl, OM1/2/ nl, RCA 50p, RPDA/RPAV, RPL1/2/3 nl. LVEDP 15-24mmHg  02/2020 Cath: LM nl, LAD nl, D1/2/3 nl, RI nl, LCX nl, OM1/2/ nl, RCA 50p, RPDA/RPAV, RPL1/2/3 nl. LVEDP 15-71mmHg   Neuro/Psych negative neurological ROS  negative psych ROS   GI/Hepatic negative GI ROS, Neg liver ROS,   Endo/Other  negative endocrine ROS  Renal/GU negative Renal ROS  negative genitourinary   Musculoskeletal  (+) Arthritis , Osteoarthritis,    Abdominal   Peds negative pediatric ROS (+)  Hematology negative hematology ROS (+)   Anesthesia Other Findings Past Medical History: 2016: Cancer ([place])     Comment:  prostate cancer  No date: HLD (hyperlipidemia) No date: Hypertension No date: Prostate cancer ([place]) No date: Thrombocytopenia (HCC)   Reproductive/Obstetrics negative OB ROS                            Anesthesia Physical  Anesthesia Plan  ASA: III  Anesthesia Plan: General and Regional   Post-op Pain Management:  Regional for Post-op pain and Regional block* and Tylenol PO (pre-op)*   Induction: Intravenous  PONV Risk Score and Plan: 2 and Ondansetron and Dexamethasone  Airway Management Planned: Oral ETT  Additional Equipment:   Intra-op Plan:    Post-operative Plan: Extubation in OR  Informed Consent: I have reviewed the patients History and Physical, chart, labs and discussed the procedure including the risks, benefits and alternatives for the proposed anesthesia with the patient or authorized representative who has indicated his/her understanding and acceptance.     Dental advisory given  Plan Discussed with: CRNA, Anesthesiologist and Surgeon  Anesthesia Plan Comments:        Anesthesia Quick Evaluation

## 2021-05-08 ENCOUNTER — Encounter
Admission: RE | Disposition: A | Discharge status: Home / Self Care | Payer: Self-pay | Source: Home / Self Care | Attending: Surgery

## 2021-05-08 ENCOUNTER — Ambulatory Visit: Payer: Medicare HMO | Admitting: Urgent Care

## 2021-05-08 ENCOUNTER — Other Ambulatory Visit: Payer: Self-pay

## 2021-05-08 ENCOUNTER — Ambulatory Visit: Payer: Medicare HMO

## 2021-05-08 ENCOUNTER — Ambulatory Visit
Admission: RE | Admit: 2021-05-08 | Discharge: 2021-05-08 | Disposition: A | Discharge status: Home / Self Care | Payer: Medicare HMO | Attending: Surgery | Admitting: Surgery

## 2021-05-08 ENCOUNTER — Encounter: Payer: Self-pay | Admitting: Surgery

## 2021-05-08 DIAGNOSIS — Z96611 Presence of right artificial shoulder joint: Secondary | ICD-10-CM | POA: Diagnosis not present

## 2021-05-08 DIAGNOSIS — M7521 Bicipital tendinitis, right shoulder: Secondary | ICD-10-CM | POA: Diagnosis not present

## 2021-05-08 DIAGNOSIS — M778 Other enthesopathies, not elsewhere classified: Secondary | ICD-10-CM | POA: Diagnosis not present

## 2021-05-08 DIAGNOSIS — I110 Hypertensive heart disease with heart failure: Secondary | ICD-10-CM | POA: Diagnosis not present

## 2021-05-08 DIAGNOSIS — M7581 Other shoulder lesions, right shoulder: Secondary | ICD-10-CM | POA: Diagnosis not present

## 2021-05-08 DIAGNOSIS — R2689 Other abnormalities of gait and mobility: Secondary | ICD-10-CM | POA: Diagnosis not present

## 2021-05-08 DIAGNOSIS — I509 Heart failure, unspecified: Secondary | ICD-10-CM | POA: Diagnosis not present

## 2021-05-08 DIAGNOSIS — I2510 Atherosclerotic heart disease of native coronary artery without angina pectoris: Secondary | ICD-10-CM | POA: Diagnosis not present

## 2021-05-08 DIAGNOSIS — E785 Hyperlipidemia, unspecified: Secondary | ICD-10-CM | POA: Insufficient documentation

## 2021-05-08 DIAGNOSIS — Z419 Encounter for procedure for purposes other than remedying health state, unspecified: Secondary | ICD-10-CM

## 2021-05-08 DIAGNOSIS — Z8546 Personal history of malignant neoplasm of prostate: Secondary | ICD-10-CM | POA: Insufficient documentation

## 2021-05-08 DIAGNOSIS — M19011 Primary osteoarthritis, right shoulder: Secondary | ICD-10-CM | POA: Diagnosis not present

## 2021-05-08 HISTORY — PX: BICEPT TENODESIS: SHX5116

## 2021-05-08 HISTORY — DX: Male erectile dysfunction, unspecified: N52.9

## 2021-05-08 HISTORY — DX: Nonrheumatic mitral (valve) insufficiency: I34.0

## 2021-05-08 HISTORY — DX: Insomnia, unspecified: G47.00

## 2021-05-08 HISTORY — PX: REVERSE SHOULDER ARTHROPLASTY: SHX5054

## 2021-05-08 HISTORY — DX: Other forms of dyspnea: R06.09

## 2021-05-08 SURGERY — ARTHROPLASTY, SHOULDER, TOTAL, REVERSE
Anesthesia: Regional | Site: Shoulder | Laterality: Right

## 2021-05-08 MED ORDER — BUPIVACAINE LIPOSOME 1.3 % IJ SUSP
INTRAMUSCULAR | Status: DC | PRN
  Administered 2021-05-08: 10 mL

## 2021-05-08 MED ORDER — ORAL CARE MOUTH RINSE: 15.0000 mL | Freq: Once | OROMUCOSAL | Status: AC

## 2021-05-08 MED ORDER — PHENYLEPHRINE HCL-NACL 20-0.9 MG/250ML-% IV SOLN
INTRAVENOUS | Status: DC | PRN
  Administered 2021-05-08: 15 ug/min via INTRAVENOUS

## 2021-05-08 MED ORDER — SODIUM CHLORIDE 0.9 % IV SOLN: INTRAVENOUS | Status: DC

## 2021-05-08 MED ORDER — ACETAMINOPHEN 10 MG/ML IV SOLN: 1000.0000 mg | Freq: Once | INTRAVENOUS | Status: DC | PRN

## 2021-05-08 MED ORDER — TRANEXAMIC ACID 1000 MG/10ML IV SOLN
INTRAVENOUS | Status: AC
  Filled 2021-05-08: qty 10

## 2021-05-08 MED ORDER — FENTANYL CITRATE (PF) 100 MCG/2ML IJ SOLN: 25.0000 ug | INTRAMUSCULAR | Status: DC | PRN

## 2021-05-08 MED ORDER — BUPIVACAINE-EPINEPHRINE (PF) 0.5% -1:200000 IJ SOLN
INTRAMUSCULAR | Status: AC
  Filled 2021-05-08: qty 30

## 2021-05-08 MED ORDER — OXYCODONE HCL 5 MG PO TABS: 5.0000 mg | ORAL_TABLET | Freq: Once | ORAL | Status: DC | PRN

## 2021-05-08 MED ORDER — LACTATED RINGERS IV SOLN: INTRAVENOUS | Status: DC | PRN

## 2021-05-08 MED ORDER — FENTANYL CITRATE (PF) 100 MCG/2ML IJ SOLN
INTRAMUSCULAR | Status: AC
  Filled 2021-05-08: qty 2

## 2021-05-08 MED ORDER — OXYCODONE HCL 5 MG/5ML PO SOLN: 5.0000 mg | Freq: Once | ORAL | Status: DC | PRN

## 2021-05-08 MED ORDER — FAMOTIDINE 20 MG PO TABS: 20.0000 mg | ORAL_TABLET | Freq: Once | ORAL | Status: AC

## 2021-05-08 MED ORDER — OXYCODONE HCL 5 MG PO TABS: 5.0000 mg | ORAL_TABLET | ORAL | 0 refills | Status: DC | PRN

## 2021-05-08 MED ORDER — PHENYLEPHRINE HCL (PRESSORS) 10 MG/ML IV SOLN
INTRAVENOUS | Status: DC | PRN
  Administered 2021-05-08: 80 ug via INTRAVENOUS

## 2021-05-08 MED ORDER — MIDAZOLAM HCL 2 MG/2ML IJ SOLN
INTRAMUSCULAR | Status: AC
  Filled 2021-05-08: qty 2

## 2021-05-08 MED ORDER — MIDAZOLAM HCL 2 MG/2ML IJ SOLN
INTRAMUSCULAR | Status: DC | PRN
  Administered 2021-05-08: 2 mg via INTRAVENOUS

## 2021-05-08 MED ORDER — 0.9 % SODIUM CHLORIDE (POUR BTL) OPTIME
TOPICAL | Status: DC | PRN
  Administered 2021-05-08: 1000 mL

## 2021-05-08 MED ORDER — LIDOCAINE HCL (CARDIAC) PF 100 MG/5ML IV SOSY
PREFILLED_SYRINGE | INTRAVENOUS | Status: DC | PRN
  Administered 2021-05-08: 80 mg via INTRAVENOUS

## 2021-05-08 MED ORDER — BUPIVACAINE HCL (PF) 0.5 % IJ SOLN
INTRAMUSCULAR | Status: AC
  Filled 2021-05-08: qty 10

## 2021-05-08 MED ORDER — KETOROLAC TROMETHAMINE 15 MG/ML IJ SOLN
INTRAMUSCULAR | Status: AC
  Administered 2021-05-08: 15 mg via INTRAVENOUS
  Filled 2021-05-08: qty 1

## 2021-05-08 MED ORDER — ACETAMINOPHEN 500 MG PO TABS: 1000.0000 mg | ORAL_TABLET | Freq: Once | ORAL | Status: AC

## 2021-05-08 MED ORDER — LIDOCAINE HCL (PF) 1 % IJ SOLN
INTRAMUSCULAR | Status: AC
  Filled 2021-05-08: qty 5

## 2021-05-08 MED ORDER — ONDANSETRON HCL 4 MG/2ML IJ SOLN
INTRAMUSCULAR | Status: DC | PRN
  Administered 2021-05-08 (×2): 4 mg via INTRAVENOUS

## 2021-05-08 MED ORDER — CEFAZOLIN SODIUM-DEXTROSE 2-4 GM/100ML-% IV SOLN
INTRAVENOUS | Status: AC
  Filled 2021-05-08: qty 100

## 2021-05-08 MED ORDER — SUGAMMADEX SODIUM 200 MG/2ML IV SOLN
INTRAVENOUS | Status: DC | PRN
Start: 2021-05-08 — End: 2021-05-08
  Administered 2021-05-08: 200 mg via INTRAVENOUS

## 2021-05-08 MED ORDER — PHENYLEPHRINE HCL (PRESSORS) 10 MG/ML IV SOLN
INTRAVENOUS | Status: AC
  Filled 2021-05-08: qty 1

## 2021-05-08 MED ORDER — METOCLOPRAMIDE HCL 5 MG/ML IJ SOLN: 5.0000 mg | Freq: Three times a day (TID) | INTRAMUSCULAR | Status: DC | PRN

## 2021-05-08 MED ORDER — LIDOCAINE HCL (PF) 1 % IJ SOLN
INTRAMUSCULAR | Status: DC | PRN
  Administered 2021-05-08: 1 mL via SUBCUTANEOUS

## 2021-05-08 MED ORDER — ACETAMINOPHEN 500 MG PO TABS: 1000.0000 mg | ORAL_TABLET | Freq: Four times a day (QID) | ORAL | Status: AC

## 2021-05-08 MED ORDER — PROPOFOL 10 MG/ML IV BOLUS
INTRAVENOUS | Status: DC | PRN
Start: 2021-05-08 — End: 2021-05-08
  Administered 2021-05-08: 140 mg via INTRAVENOUS
  Administered 2021-05-08: 60 mg via INTRAVENOUS

## 2021-05-08 MED ORDER — EPHEDRINE SULFATE (PRESSORS) 50 MG/ML IJ SOLN
INTRAMUSCULAR | Status: DC | PRN
Start: 2021-05-08 — End: 2021-05-08
  Administered 2021-05-08 (×4): 5 mg via INTRAVENOUS

## 2021-05-08 MED ORDER — GLYCOPYRROLATE 0.2 MG/ML IJ SOLN
INTRAMUSCULAR | Status: DC | PRN
  Administered 2021-05-08: .2 mg via INTRAVENOUS

## 2021-05-08 MED ORDER — BUPIVACAINE LIPOSOME 1.3 % IJ SUSP
INTRAMUSCULAR | Status: AC
  Filled 2021-05-08: qty 10

## 2021-05-08 MED ORDER — PROPOFOL 1000 MG/100ML IV EMUL
INTRAVENOUS | Status: AC
  Filled 2021-05-08: qty 100

## 2021-05-08 MED ORDER — FAMOTIDINE 20 MG PO TABS
ORAL_TABLET | ORAL | Status: AC
  Administered 2021-05-08: 20 mg via ORAL
  Filled 2021-05-08: qty 1

## 2021-05-08 MED ORDER — BUPIVACAINE-EPINEPHRINE (PF) 0.5% -1:200000 IJ SOLN
INTRAMUSCULAR | Status: DC | PRN
  Administered 2021-05-08: 30 mL

## 2021-05-08 MED ORDER — BUPIVACAINE HCL (PF) 0.5 % IJ SOLN
INTRAMUSCULAR | Status: DC | PRN
  Administered 2021-05-08: 10 mL via PERINEURAL

## 2021-05-08 MED ORDER — CHLORHEXIDINE GLUCONATE 0.12 % MT SOLN: 15.0000 mL | Freq: Once | OROMUCOSAL | Status: AC

## 2021-05-08 MED ORDER — ACETAMINOPHEN 500 MG PO TABS
ORAL_TABLET | ORAL | Status: AC
  Administered 2021-05-08: 1000 mg via ORAL
  Filled 2021-05-08: qty 2

## 2021-05-08 MED ORDER — ONDANSETRON HCL 4 MG PO TABS: 4.0000 mg | ORAL_TABLET | Freq: Four times a day (QID) | ORAL | Status: DC | PRN

## 2021-05-08 MED ORDER — DEXAMETHASONE SODIUM PHOSPHATE 10 MG/ML IJ SOLN
INTRAMUSCULAR | Status: DC | PRN
Start: 2021-05-08 — End: 2021-05-08
  Administered 2021-05-08: 10 mg via INTRAVENOUS

## 2021-05-08 MED ORDER — FENTANYL CITRATE PF 50 MCG/ML IJ SOSY
PREFILLED_SYRINGE | INTRAMUSCULAR | Status: AC
  Filled 2021-05-08: qty 1

## 2021-05-08 MED ORDER — FENTANYL CITRATE (PF) 100 MCG/2ML IJ SOLN: INTRAMUSCULAR | Status: DC | PRN

## 2021-05-08 MED ORDER — KETOROLAC TROMETHAMINE 15 MG/ML IJ SOLN: 15.0000 mg | Freq: Once | INTRAMUSCULAR | Status: AC

## 2021-05-08 MED ORDER — LACTATED RINGERS IV SOLN: INTRAVENOUS | Status: DC

## 2021-05-08 MED ORDER — ONDANSETRON HCL 4 MG/2ML IJ SOLN: 4.0000 mg | Freq: Four times a day (QID) | INTRAMUSCULAR | Status: DC | PRN

## 2021-05-08 MED ORDER — PROMETHAZINE HCL 25 MG/ML IJ SOLN: 6.2500 mg | INTRAMUSCULAR | Status: DC | PRN

## 2021-05-08 MED ORDER — CEFAZOLIN SODIUM-DEXTROSE 2-4 GM/100ML-% IV SOLN: 2.0000 g | Freq: Three times a day (TID) | INTRAVENOUS | Status: DC

## 2021-05-08 MED ORDER — CHLORHEXIDINE GLUCONATE 0.12 % MT SOLN
OROMUCOSAL | Status: AC
  Administered 2021-05-08: 15 mL via OROMUCOSAL
  Filled 2021-05-08: qty 15

## 2021-05-08 MED ORDER — CEFAZOLIN SODIUM-DEXTROSE 2-4 GM/100ML-% IV SOLN
INTRAVENOUS | Status: AC
  Administered 2021-05-08: 2 g via INTRAVENOUS
  Filled 2021-05-08: qty 100

## 2021-05-08 MED ORDER — METOCLOPRAMIDE HCL 10 MG PO TABS: 5.0000 mg | ORAL_TABLET | Freq: Three times a day (TID) | ORAL | Status: DC | PRN

## 2021-05-08 MED ORDER — ROCURONIUM BROMIDE 100 MG/10ML IV SOLN
INTRAVENOUS | Status: DC | PRN
  Administered 2021-05-08: 50 mg via INTRAVENOUS
  Administered 2021-05-08: 20 mg via INTRAVENOUS

## 2021-05-08 MED ORDER — TRANEXAMIC ACID 1000 MG/10ML IV SOLN
INTRAVENOUS | Status: DC | PRN
  Administered 2021-05-08: 1000 mg via TOPICAL

## 2021-05-08 MED ORDER — OXYCODONE HCL 5 MG PO TABS: 5.0000 mg | ORAL_TABLET | ORAL | Status: DC | PRN

## 2021-05-08 MED ORDER — FENTANYL CITRATE PF 50 MCG/ML IJ SOSY: 50.0000 ug | PREFILLED_SYRINGE | Freq: Once | INTRAMUSCULAR | Status: DC

## 2021-05-08 MED ORDER — SODIUM CHLORIDE 0.9 % IR SOLN
Status: DC | PRN
  Administered 2021-05-08: 3000 mL

## 2021-05-08 MED ORDER — CEFAZOLIN SODIUM-DEXTROSE 2-4 GM/100ML-% IV SOLN
2.0000 g | Freq: Once | INTRAVENOUS | Status: AC
Start: 2021-05-08 — End: 2021-05-08
  Administered 2021-05-08: 2 g via INTRAVENOUS

## 2021-05-08 SURGICAL SUPPLY — 61 items
BASEPLATE GLENOSPHERE 25 (Plate) ×1 IMPLANT
BIT DRILL TWIST 2.7 (BIT) ×1 IMPLANT
BLADE SAW SAG 25X90X1.19 (BLADE) ×2 IMPLANT
CHLORAPREP W/TINT 26 (MISCELLANEOUS) ×2 IMPLANT
COOLER POLAR GLACIER W/PUMP (MISCELLANEOUS) ×2 IMPLANT
COVER BACK TABLE REUSABLE LG (DRAPES) ×2 IMPLANT
CUP PROS REVERSE SHLD 40 +3 (Cup) ×1 IMPLANT
DIAL VERSA SHOULDER 40 STD (Joint) ×1 IMPLANT
DRAPE 3/4 80X56 (DRAPES) ×2 IMPLANT
DRAPE INCISE IOBAN 66X45 STRL (DRAPES) ×2 IMPLANT
DRSG OPSITE POSTOP 4X8 (GAUZE/BANDAGES/DRESSINGS) ×2 IMPLANT
ELECT BLADE 6.5 EXT (BLADE) IMPLANT
ELECT CAUTERY BLADE 6.4 (BLADE) ×2 IMPLANT
ELECT REM PT RETURN 9FT ADLT (ELECTROSURGICAL) ×2
ELECTRODE REM PT RTRN 9FT ADLT (ELECTROSURGICAL) ×1 IMPLANT
GAUZE XEROFORM 1X8 LF (GAUZE/BANDAGES/DRESSINGS) ×2 IMPLANT
GLOVE SRG 8 PF TXTR STRL LF DI (GLOVE) ×2 IMPLANT
GLOVE SURG ENC MOIS LTX SZ7.5 (GLOVE) ×8 IMPLANT
GLOVE SURG ENC MOIS LTX SZ8 (GLOVE) ×8 IMPLANT
GLOVE SURG UNDER LTX SZ8 (GLOVE) ×2 IMPLANT
GLOVE SURG UNDER POLY LF SZ8 (GLOVE) ×2
GOWN STRL REUS W/ TWL LRG LVL3 (GOWN DISPOSABLE) ×1 IMPLANT
GOWN STRL REUS W/ TWL XL LVL3 (GOWN DISPOSABLE) ×1 IMPLANT
GOWN STRL REUS W/TWL LRG LVL3 (GOWN DISPOSABLE) ×1
GOWN STRL REUS W/TWL XL LVL3 (GOWN DISPOSABLE) ×1
IV NS IRRIG 3000ML ARTHROMATIC (IV SOLUTION) ×2 IMPLANT
KIT STABILIZATION SHOULDER (MISCELLANEOUS) ×2 IMPLANT
KIT TURNOVER KIT A (KITS) ×2 IMPLANT
MANIFOLD NEPTUNE II (INSTRUMENTS) ×2 IMPLANT
MASK FACE SPIDER DISP (MASK) ×2 IMPLANT
MAT ABSORB  FLUID 56X50 GRAY (MISCELLANEOUS) ×1
MAT ABSORB FLUID 56X50 GRAY (MISCELLANEOUS) ×1 IMPLANT
NDL SAFETY ECLIPSE 18X1.5 (NEEDLE) ×1 IMPLANT
NDL SPNL 20GX3.5 QUINCKE YW (NEEDLE) ×1 IMPLANT
NEEDLE HYPO 18GX1.5 SHARP (NEEDLE) ×1
NEEDLE SPNL 20GX3.5 QUINCKE YW (NEEDLE) ×2 IMPLANT
NS IRRIG 500ML POUR BTL (IV SOLUTION) ×2 IMPLANT
PACK ARTHROSCOPY SHOULDER (MISCELLANEOUS) ×2 IMPLANT
PAD ARMBOARD 7.5X6 YLW CONV (MISCELLANEOUS) ×2 IMPLANT
PAD WRAPON POLAR SHDR UNIV (MISCELLANEOUS) ×1 IMPLANT
PIN THREADED REVERSE (PIN) ×1 IMPLANT
PULSAVAC PLUS IRRIG FAN TIP (DISPOSABLE) ×2
SCREW BONE LOCKING 4.75X35X3.5 (Screw) ×2 IMPLANT
SCREW BONE STRL 6.5MMX30MM (Screw) ×1 IMPLANT
SCREW LOCKING 4.75MMX15MM (Screw) ×2 IMPLANT
SLING ULTRA II M (MISCELLANEOUS) ×1 IMPLANT
SPONGE T-LAP 18X18 ~~LOC~~+RFID (SPONGE) ×4 IMPLANT
STAPLER SKIN PROX 35W (STAPLE) ×2 IMPLANT
STEM HUMERAL MICRO SZ10 (Stem) ×1 IMPLANT
SUT ETHIBOND 0 MO6 C/R (SUTURE) ×2 IMPLANT
SUT FIBERWIRE #2 38 BLUE 1/2 (SUTURE) ×8
SUT VIC AB 0 CT1 36 (SUTURE) ×2 IMPLANT
SUT VIC AB 2-0 CT1 27 (SUTURE) ×2
SUT VIC AB 2-0 CT1 TAPERPNT 27 (SUTURE) ×2 IMPLANT
SUTURE FIBERWR #2 38 BLUE 1/2 (SUTURE) ×4 IMPLANT
SYR 10ML LL (SYRINGE) ×2 IMPLANT
SYR 30ML LL (SYRINGE) ×2 IMPLANT
TIP FAN IRRIG PULSAVAC PLUS (DISPOSABLE) ×1 IMPLANT
TRAY HUMERAL MICRO SZ10 (Shoulder) ×1 IMPLANT
WATER STERILE IRR 500ML POUR (IV SOLUTION) ×2 IMPLANT
WRAPON POLAR PAD SHDR UNIV (MISCELLANEOUS) ×2

## 2021-05-08 NOTE — Discharge Instructions (Addendum)
Orthopedic discharge instructions: May shower with intact OpSite dressing once nerve block has worn off, then reapply shoulder immobilizer. Apply ice frequently to shoulder or use Polar Care device. Take ibuprofen 600-800 mg TID with meals for 3-5 days, then as necessary. Take oxycodone as prescribed when needed.  May supplement with ES Tylenol if necessary. Keep shoulder immobilizer on at all times except may remove for bathing purposes. Follow-up in 10-14 days or as scheduled.  AMBULATORY SURGERY  DISCHARGE INSTRUCTIONS   The drugs that you were given will stay in your system until tomorrow so for the next 24 hours you should not:  Drive an automobile Make any legal decisions Drink any alcoholic beverage   You may resume regular meals tomorrow.  Today it is better to start with liquids and gradually work up to solid foods.  You may eat anything you prefer, but it is better to start with liquids, then soup and crackers, and gradually work up to solid foods.   Please notify your doctor immediately if you have any unusual bleeding, trouble breathing, redness and pain at the surgery site, drainage, fever, or pain not relieved by medication.    Additional Instructions:     Please contact your physician with any problems or Same Day Surgery at [phone], Monday through Friday 6 am to 4 pm, or [place] at [facility] number at [phone].      Interscalene Nerve Block with Exparel   For your surgery you have received an Interscalene Nerve Block with Exparel. Nerve Blocks affect many types of nerves, including nerves that control movement, pain and normal sensation.  You may experience feelings such as numbness, tingling, heaviness, weakness or the inability to move your arm or the feeling or sensation that your arm has "fallen asleep". A nerve block with Exparel can last up to 5 days.  Usually the weakness wears off first.  The tingling and heaviness usually wear off  next.  Finally you may start to notice pain.  Keep in mind that this may occur in any order.  Once a nerve block starts to wear off it is usually completely gone within 60 minutes. ISNB may cause mild shortness of breath, a hoarse voice, blurry vision, unequal pupils, or drooping of the face on the same side as the nerve block.  These symptoms will usually resolve with the numbness.  Very rarely the procedure itself can cause mild seizures. If needed, your surgeon will give you a prescription for pain medication.  It will take about 60 minutes for the oral pain medication to become fully effective.  So, it is recommended that you start taking this medication before the nerve block first begins to wear off, or when you first begin to feel discomfort. Take your pain medication only as prescribed.  Pain medication can cause sedation and decrease your breathing if you take more than you need for the level of pain that you have. Nausea is a common side effect of many pain medications.  You may want to eat something before taking your pain medicine to prevent nausea. After an Interscalene nerve block, you cannot feel pain, pressure or extremes in temperature in the effected arm.  Because your arm is numb it is at an increased risk for injury.  To decrease the possibility of injury, please practice the following:  While you are awake change the position of your arm frequently to prevent too much pressure on any one area for prolonged periods of time.  If  you have a cast or tight dressing, check the color or your fingers every couple of hours.  Call your surgeon with the appearance of any discoloration (white or blue). If you are given a sling to wear before you go home, please wear it  at all times until the block has completely worn off.  Do not get up at night without your sling. Please contact [facility] or your surgeon if you do not begin to regain sensation after 7 days from the surgery.  Anesthesia may  be contacted by calling the Same Day Surgery Department, Mon. through Fri., 6 am to 4 pm at [phone].   If you experience any other problems or concerns, please contact your surgeon's office. If you experience severe or prolonged shortness of breath go to the nearest emergency department.   POLAR CARE INFORMATION  [email or web address]  How to use Poca Cold Therapy System?   YouTube   [email or web address]  OPERATING INSTRUCTIONS  Start the product With dry hands, connect the transformer to the electrical connection located on the top of the cooler. Next, plug the transformer into an appropriate electrical outlet. The unit will automatically start running at this point.  To stop the pump, disconnect electrical power.  Unplug to stop the product when not in use. Unplugging the Polar Care unit turns it off. Always unplug immediately after use. Never leave it plugged in while unattended. Remove pad.    FIRST ADD WATER TO FILL LINE, THEN ICE---Replace ice when existing ice is almost melted  1 Discuss Treatment with your [facility] Practitioner and Use Only as Prescribed 2 Apply Insulation Barrier & Cold Therapy Pad 3 Check for Moisture 4 Inspect Skin Regularly  Tips and Trouble Shooting Usage Tips 1. Use cubed or chunked ice for optimal performance. 2. It is recommended to drain the Pad between uses. To drain the pad, hold the Pad upright with the hose pointed toward the ground. Depress the black plunger and allow water to drain out. 3. You may disconnect the Pad from the unit without removing the pad from the affected area by depressing the silver tabs on the hose coupling and gently pulling the hoses apart. The Pad and unit will seal itself and will not leak. Note: Some dripping during release is normal. 4. DO NOT RUN PUMP WITHOUT WATER! The pump in this unit is designed to run with water. Running the unit without water will cause permanent  damage to the pump. 5. Unplug unit before removing lid.  TROUBLESHOOTING GUIDE Pump not running, Water not flowing to the pad, Pad is not getting cold 1. Make sure the transformer is plugged into the wall outlet. 2. Confirm that the ice and water are filled to the indicated levels. 3. Make sure there are no kinks in the pad. 4. Gently pull on the blue tube to make sure the tube/pad junction is straight. 5. Remove the pad from the treatment site and ll it while the pad is lying at; then reapply. 6. Confirm that the pad couplings are securely attached to the unit. Listen for the double clicks (Figure 1) to confirm the pad couplings are securely attached.  Leaks    Note: Some condensation on the lines, controller, and pads is unavoidable, especially in warmer climates. 1. If using a Breg Polar Care Cold Therapy unit with a detachable Cold Therapy Pad, and a leak exists (other than condensation on the lines) disconnect the pad couplings. Make sure the silver tabs  on the couplings are depressed before reconnecting the pad to the pump hose; then confirm both sides of the coupling are properly clicked in. 2. If the coupling continues to leak or a leak is detected in the pad itself, stop using it and call [facility] at [phone].  Cleaning After use, empty and dry the unit with a soft cloth. Warm water and mild detergent may be used occasionally to clean the pump and tubes.  WARNING: The Ewing can be cold enough to cause serious injury, including full skin necrosis. Follow these Operating Instructions, and carefully read the Product Insert (see pouch on side of unit) and the Cold Therapy Pad Fitting Instructions (provided with each Cold Therapy Pad) prior to use.    SHOULDER SLING IMMOBILIZER   VIDEO Slingshot 2 Shoulder Brace Application - YouTube ---[email or web address]  INSTRUCTIONS While supporting the injured arm, slide the forearm into the  sling. Wrap the adjustable shoulder strap around the neck and shoulders and attach the strap end to the sling using  the alligator strap tab.  Adjust the shoulder strap to the required length. Position the shoulder pad behind the neck. To secure the shoulder pad location (optional), pull the shoulder strap away from the shoulder pad, unfold the hook material on the top of the pad, then press the shoulder strap back onto the hook material to secure the pad in place. Attach the closure strap across the open top of the sling. Position the strap so that it holds the arm securely in the sling. Next, attach the thumb strap to the open end of the sling between the thumb and fingers. After sling has been fit, it may be easily removed and reapplied using the quick release buckle on shoulder strap. If a neutral pillow or 15 abduction pillow is included, place the pillow at the waistline. Attach the sling to the pillow, lining up hook material on the pillow with the loop on sling. Adjust the waist strap to fit.  If waist strap is too long, cut it to fit. Use the small piece of double sided hook material (located on top of the pillow) to secure the strap end. Place the double sided hook material on the inside of the cut strap end and secure it to the waist strap.     If no pillow is included, attach the waist strap to the sling and adjust to fit.    Washing Instructions: Straps and sling must be removed and cleaned regularly depending on your activity level and perspiration. Hand wash straps and sling in cold water with mild detergent, rinse, air dry

## 2021-05-08 NOTE — Op Note (Signed)
05/08/2021  10:16 AM  Patient:   Larry Allen  Pre-Op Diagnosis:   Advanced degenerative joint disease with rotator cuff tendinopathy and biceps tendinopathy, right shoulder.  Post-Op Diagnosis:   Same  Procedure:   Reverse right total shoulder arthroplasty with biceps tenodesis.  Surgeon:   Pascal Lux, MD  Assistant:   Cameron Proud, PA-C  Anesthesia:   General endotracheal with an interscalene block using Exparel placed preoperatively by the anesthesiologist.  Findings:   As above.  Complications:   None  EBL:    125 cc  Fluids:   800 cc crystalloid  UOP:   None  TT:   None  Drains:   None  Closure:   Staples  Implants:   All press-fit Biomet Comprehensive system with a #10 Identity micro-humeral stem, a 40 mm extended neutral humeral tray with a +3 mm insert, and a mini-base plate with a 40 mm glenosphere.  Brief Clinical Note:   The patient is a 81 year old male with a long history of progressively worsening right shoulder pain. His symptoms have progressed despite medications, activity modification, etc. His history and examination are consistent with advanced degenerative joint disease.  Preoperative MRI scanning confirmed the presence of advanced biceps tendinopathy, as well as moderate to severe rotator cuff tendinopathy. The patient presents at this time for a reverse right total shoulder arthroplasty with biceps tenodesis.  Procedure:   The patient underwent placement of an interscalene block using Exparel by the anesthesiologist in the preoperative holding area before being brought into the operating room and lain in the supine position. The patient then underwent general endotracheal intubation and anesthesia before the patient was repositioned in the beach chair position using the beach chair positioner. The right shoulder and upper extremity were prepped with ChloraPrep solution before being draped sterilely. Preoperative antibiotics were administered. A  timeout was performed to verify the appropriate surgical site.    A standard anterior approach to the shoulder was made through an approximately 4-5 inch incision. The incision was carried down through the subcutaneous tissues to expose the deltopectoral fascia. The interval between the deltoid and pectoralis muscles was identified and this plane developed, retracting the cephalic vein laterally with the deltoid muscle. The conjoined tendon was identified. Its lateral margin was dissected and the Kolbel self-retraining retractor inserted. The "three sisters" were identified and cauterized. Bursal tissues were removed to improve visualization.   The biceps tendon was identified near the inferior aspect of the bicipital groove. A soft tissue tenodesis was performed by attaching the biceps tendon to the adjacent pectoralis major tendon using two #0 Ethibond interrupted sutures. The biceps tendon was then transected just proximal to the tenodesis site. The subscapularis tendon was released from its attachment to the lesser tuberosity 1 cm proximal to its insertion and several tagging sutures placed. The inferior capsule was released with care after identifying and protecting the axillary nerve. The proximal humeral cut was made at approximately 25 of retroversion using the extra-medullary guide.   Attention was redirected to the glenoid. The labrum was debrided circumferentially before the center of the glenoid was marked with electrocautery. The guidewire was drilled into the glenoid neck using the appropriate guide. After verifying its position, it was overreamed with the mini-baseplate reamer to create a flat surface. The permanent mini-baseplate was impacted into place. It was stabilized with a 30 x 6.5 mm central screw and four peripheral locking screws. The permanent 40 mm glenosphere was then impacted into place and its [employer]  taper locking mechanism verified using manual distraction.  Attention was  directed to the humeral side. The humeral canal was reamed sequentially beginning with the end-cutting reamer then progressing from a 4 mm reamer up to a 12 mm reamer. This provided excellent circumferential chatter. The canal was broached beginning with a #8 Identity broach and progressing to a #10 broach. This was left in place and a trial reduction performed using the -6 mm and +0 mm trial humeral platforms with the +0 mm and +3 mm inserts.  With the +0 trial humeral platform and the +3 mm insert, the shoulder demonstrated good tension. In addition, arm demonstrated excellent range of motion as the hand could be brought across the chest to the opposite shoulder and brought to the top of the patient's head and to the patient's ear. The shoulder appeared stable throughout this range of motion. The joint was dislocated and the trial components removed. The permanent #10 Identity micro-stem with the permanent +0 mm extended neutral 40 mm humeral platform was impacted into place with care taken to maintain the appropriate version. The +3 mm insert was impacted into place. The locking mechanism was verified before the shoulder was relocated using two finger pressure and again placed through a range of motion with the findings as described above.  The wound was copiously irrigated with sterile saline solution using the jet lavage system before a total of 10 cc of Exparel and 30 cc of 0.5% Sensorcaine with epinephrine was injected into the pericapsular and peri-incisional tissues to help with postoperative analgesia. The subscapularis tendon was reapproximated using #2 FiberWire interrupted sutures. The deltopectoral interval was closed using #0 Vicryl interrupted sutures before the subcutaneous tissues were closed using 2-0 Vicryl interrupted sutures. The skin was closed using staples. Prior to closing the skin, 1 g of transexemic acid in 10 cc of normal saline was injected intra-articularly to help with postoperative  bleeding. A sterile occlusive dressing was applied to the wound before the arm was placed into a shoulder immobilizer with an abduction pillow. A Polar Care system also was applied to the shoulder. The patient was then transferred back to a hospital bed before being awakened, extubated, and returned to the recovery room in satisfactory condition after tolerating the procedure well.

## 2021-05-08 NOTE — Anesthesia Procedure Notes (Signed)
Anesthesia Regional Block: Interscalene brachial plexus block   Pre-Anesthetic Checklist: , timeout performed,  Correct Patient, Correct Site, Correct Laterality,  Correct Procedure, Correct Position, site marked,  Risks and benefits discussed,  Surgical consent,  Pre-op evaluation,  At surgeon's request and post-op pain management  Laterality: Upper and Right  Prep: chloraprep       Needles:  Injection technique: Single-shot  Needle Type: Stimiplex     Needle Length: 9cm  Needle Gauge: 22     Additional Needles:   Procedures:,,,, ultrasound used (permanent image in chart),,    Narrative:  Start time: 05/08/2021 7:10 AM End time: 05/08/2021 7:14 AM Injection made incrementally with aspirations every 20 mL.  Performed by: Personally  Anesthesiologist: Piscitello, Precious Haws, MD  Additional Notes: Patient consented for risk and benefits of nerve block including but not limited to nerve damage, failed block, bleeding and infection.  Patient voiced understanding.  Functioning IV was confirmed and monitors were applied.  Timeout done prior to procedure and prior to any sedation being given to the patient.  Patient confirmed procedure site prior to any sedation given to the patient.  A 51mm 22ga Stimuplex needle was used. Sterile prep,hand hygiene and sterile gloves were used.  Minimal sedation used for procedure.  No paresthesia endorsed by patient during the procedure.  Negative aspiration and negative test dose prior to incremental administration of local anesthetic. The patient tolerated the procedure well with no immediate complications.

## 2021-05-08 NOTE — H&P (Signed)
History of Present Illness:  Larry Allen is a 81 y.o. male who presents today as a result of a call to our clinic for right shoulder pain.   The patient's symptoms began several years ago and developed without any specific cause or injury. He has seen Cameron Proud, PA-C, on several occasions for the symptoms, most recently in August, 2022, at which time he received a steroid injection which provided approximately 3 weeks worth of relief. The patient is now nearly 1 year status post a left total shoulder arthroplasty from which he has done quite well.   The patient describes the symptoms as moderate (patient is active but has had to make modifications or give up activities) and have the quality of being aching, nagging, stabbing and tender. The pain is localized to the lateral arm/shoulder and localized to the anterior shoulder. These symptoms are aggravated with normal daily activities, with sleeping, carrying heavy objects, with overhead activity and reaching behind the back. He has tried acetaminophen with limited benefit. He has tried rest with no significant benefit. He has tried several injections in the past with temporary partial relief. The patient denies any neck pain, nor does he note any numbness or paresthesias down his arm to his hand. He is right-hand dominant. This complaint is not work related. He is a sports non-participant.  Shoulder Surgical History:  The patient has had a left total shoulder arthroplasty, but has not had any prior right shoulder surgery in the past.  PMH/PSH/Family History/Social History/Meds/Allergies:  I have reviewed past medical, surgical, social and family history, medications and allergies as documented in the EMR.  Current Outpatient Medications:  acetaminophen (TYLENOL) 500 MG tablet Take 500 mg by mouth as needed   aspirin 81 MG EC tablet Take by mouth Take 81 mg by mouth daily.   calcium carbonate 600 mg calcium (1,500 mg) Tab tablet Take by mouth    carvediloL (COREG) 3.125 MG tablet Take 1 tablet by mouth 2 (two) times daily   cholecalciferol (VITAMIN D3) 1000 unit tablet Take by mouth Take 1,000 Units by mouth daily.   co-enzyme Q-10, ubiquinone, 100 mg capsule Take by mouth Take 100 mg by mouth daily.   cyanocobalamin (VITAMIN B12) 1000 MCG tablet Take by mouth Take 1,000 mcg by mouth daily.   docusate (COLACE) 100 MG capsule Take by mouth Take 100 mg by mouth daily.   fluticasone propionate (FLONASE) 50 mcg/actuation nasal spray Place 2 sprays into both nostrils once daily   FUROsemide (LASIX) 20 MG tablet Take 1 tablet by mouth nightly as needed   Lacto.acidophilus-Bif.animalis 32 billion cell Cap Take 1 capsule by mouth once daily   lisinopriL-hydrochlorothiazide (ZESTORETIC) 20-12.5 mg tablet Take 1 tablet by mouth once daily   loratadine (CLARITIN) 10 mg tablet Take 1 tablet by mouth once daily   multivitamin tablet Take 1 tablet by mouth once daily   polyethylene glycol (MIRALAX) powder Take by mouth Take 17 g by mouth daily.   rosuvastatin (CRESTOR) 20 MG tablet Take 20 mg by mouth once daily   sildenafil (REVATIO) 20 mg tablet Take 1 tablet by mouth nightly as needed   simvastatin (ZOCOR) 20 MG tablet Take 1 tablet by mouth at bedtime   zolpidem (AMBIEN) 5 MG tablet Take 1 tablet by mouth nightly as needed   Allergies:   Amoxicillin Swelling of the mouth   Past Medical History:   Abnormal EKG 02/28/2020   Abnormal stress test 03/12/2020   Allergic rhinitis 04/05/2015  Astigmatism with presbyopia, bilateral   Coronary artery disease involving native coronary artery of native heart without angina pectoris 03/21/2020   Essential hypertension 04/05/2015   Hyperlipidemia   Hyperlipidemia LDL goal <130 04/05/2015   Hypertension   Irregular heart beat   Malignant neoplasm of prostate (CMS-HCC) 11/21/2014   Nuclear sclerotic cataract of both eyes   Osteoarthritis of left knee 12/07/2018   Osteopenia 03/21/2020   Primary  osteoarthritis of left shoulder 05/03/2020   Prostate cancer (CMS-HCC) 2016   Status post replacement of left shoulder joint 05/15/2020   Thrombocytopenia (CMS-HCC)   Past Surgical History:   PROSTATE BIOPSY 10/23/2014   Left Total Knee Arthroplasty Left 01/16/2019 Pennie Rushing, MD)   INGUINAL HERNIA REPAIR ROBOTIC ASSISTED Left 03/03/2019  Gayland Curry, MD)   Left total shoulder arthroplasty. Left 04/30/2020 (Dr. Roland Rack)   APPENDECTOMY   INGUINAL HERNIA REPAIR  as a child   Family History:   Diabetes Mother   Heart disease Mother   Colon cancer Mother   High blood pressure (Hypertension) Mother   Cataracts Father   Heart disease Sister   Heart failure Brother   Heart disease Brother   Cataracts Maternal Grandmother   Cataracts Maternal Grandfather   Cataracts Paternal Grandmother   Cataracts Paternal Grandfather   Prostate cancer Neg Hx   Social History:   Socioeconomic History:   Marital status: Married  Tobacco Use   Smoking status: Never   Smokeless tobacco: Never  Vaping Use   Vaping Use: Never used  Substance and Sexual Activity   Alcohol use: Never   Drug use: Never   Sexual activity: Yes  Partners: Female   Review of Systems:  A comprehensive 14 point ROS was performed, reviewed, and the pertinent orthopaedic findings are documented in the HPI.  Physical Exam:  Vitals:  04/11/21 0813  BP: 114/76  Weight: 82.1 kg (181 lb)  Height: 170.2 cm (5\' 7" )  PainSc: 2  PainLoc: Shoulder   General/Constitutional: The patient appears to be well-nourished, well-developed, and in no acute distress. Neuro/Psych: Normal mood and affect, oriented to person, place and time. Eyes: Non-icteric. Pupils are equal, round, and reactive to light, and exhibit synchronous movement. ENT: Unremarkable. Lymphatic: No palpable adenopathy. Respiratory: Lungs clear to auscultation, Normal chest excursion, No wheezes and Non-labored breathing Cardiovascular: Regular rate and  rhythm. No murmurs. and No edema, swelling or tenderness, except as noted in detailed exam. Integumentary: No impressive skin lesions present, except as noted in detailed exam. Musculoskeletal: Unremarkable, except as noted in detailed exam.  Right shoulder exam: SKIN: normal SWELLING: none WARMTH: none LYMPH NODES: no adenopathy palpable CREPITUS: Intermittent mild glenohumeral crepitance TENDERNESS: Minimally tender over anterolateral shoulder ROM (active):  Forward flexion: 155 degrees Abduction: 145 degrees Internal rotation: Right buttock ROM (passive):  Forward flexion: 160 degrees Abduction: 150 degrees ER/IR at 90 abd: 70 degrees/25 degrees  He experiences moderate pain at the extremes of all motions.  STRENGTH: Forward flexion: 4-4+/5 Abduction: 4/5 External rotation: 4/5 Internal rotation: 4-4+/5 Pain with RC testing: Mild pain with resisted forward flexion and abduction  STABILITY: Normal  SPECIAL TESTS: Luan Pulling' test: positive, mild Speed's test: Not evaluated Capsulitis - pain w/ passive ER: no Crossed arm test: no Crank: Not evaluated Anterior apprehension: Negative Posterior apprehension: Not evaluated  He is neurovascularly intact to the right upper extremity.  Shoulder X-Ray Imaging: True AP, Y-scapular, and axillary views of the right shoulder are obtained. These films demonstrate significant degenerative changes with complete loss of  the glenohumeral joint space and an inferior humeral osteophyte. The subacromial space is mildly decreased. There is no subacromial or infra-clavicular spurring. He demonstrates a Type II acromion.  Assessment:   Primary osteoarthritis of right shoulder.   Rotator cuff tendinitis, right.   Plan:  The treatment options were discussed with the patient. In addition, patient educational materials were provided regarding the diagnosis and treatment options. The patient is quite frustrated by his progressively worsening  symptoms and functional limitations, and is ready to consider more aggressive treatment options. Therefore, I have recommended a surgical procedure, specifically a reverse right total shoulder replacement. The procedure was discussed with the patient, as were the potential risks (including bleeding, infection, nerve and/or blood vessel injury, persistent or recurrent pain, loosening and/or failure of the components, dislocation, need for further surgery, blood clots, strokes, heart attacks and/or arhythmias, pneumonia, etc.) and benefits. The patient states his understanding and wishes to proceed. Meanwhile, he may continue with his normal daily activities but is to avoid offending activities. He may take over-the-counter medications as needed for discomfort. All of the patient's questions and concerns were answered. He can call any time with further concerns. He will follow up post-surgery, routine.   H&P reviewed and patient re-examined. No changes.

## 2021-05-08 NOTE — Transfer of Care (Signed)
Immediate Anesthesia Transfer of Care Note  Patient: Larry Allen  Procedure(s) Performed: REVERSE SHOULDER ARTHROPLASTY (Right: Shoulder) BICEPS TENODESIS (Right: Shoulder)  Patient Location: PACU  Anesthesia Type:General  Level of Consciousness: awake, drowsy and patient cooperative  Airway & Oxygen Therapy: Patient Spontanous Breathing  Post-op Assessment: Report given to RN and Post -op Vital signs reviewed and stable  Post vital signs: Reviewed and stable  Last Vitals:  Vitals Value Taken Time  BP 147/56 05/08/21 1003  Temp 35.9 C 05/08/21 1003  Pulse 58 05/08/21 1014  Resp 16 05/08/21 1014  SpO2 96 % 05/08/21 1014  Vitals shown include unvalidated device data.  Last Pain:  Vitals:   05/08/21 1003  TempSrc:   PainSc: Asleep         Complications: No notable events documented.

## 2021-05-08 NOTE — Anesthesia Postprocedure Evaluation (Signed)
Anesthesia Post Note  Patient: Larry Allen  Procedure(s) Performed: REVERSE SHOULDER ARTHROPLASTY (Right: Shoulder) BICEPS TENODESIS (Right: Shoulder)  Patient location during evaluation: PACU Anesthesia Type: Regional Level of consciousness: awake and alert Pain management: pain level controlled Vital Signs Assessment: post-procedure vital signs reviewed and stable Respiratory status: spontaneous breathing, nonlabored ventilation and respiratory function stable Cardiovascular status: blood pressure returned to baseline and stable Postop Assessment: no apparent nausea or vomiting Anesthetic complications: no   No notable events documented.   Last Vitals:  Vitals:   05/08/21 1045 05/08/21 1107  BP: 132/71 (!) 130/56  Pulse: (!) 57 61  Resp: (!) 24 14  Temp:  (!) 36.1 C  SpO2: 97% 96%    Last Pain:  Vitals:   05/08/21 1107  TempSrc: Temporal  PainSc: 0-No pain                 [place] Ouch

## 2021-05-08 NOTE — Evaluation (Signed)
Occupational Therapy Evaluation Patient Details Name: Larry Allen MRN: [id number] DOB: 12-Jun-1940 Today's Date: 05/08/2021   History of Present Illness 81 y.o. male s/p reverse R TSA + bicep tenodesis on 05/08/21. PMHx includes: CAD, systolic/diastolic heart failure, NICM, bifascicular block (RBBB + LAFB), MV regurgitation, HTN, HLD, DOE, OA, thrombocytopenia, insomnia, and L TSA ~1 yr ago.   Clinical Impression   Patient was seen for an OT evaluation this date. Pt lives his spouse in a 1 story home with 2 steps to enter. Prior to surgery, pt was active and independent. Pt has orders for RUE to be immobilized and will be NWBing per MD. Patient presents with impaired strength/ROM, pain, and sensation to RUE with block not completely resolved yet. These impairments result in a decreased ability to perform self care tasks requiring mod assist for UB ADL, MIN A for LB ADL, and MAX A for application of polar care, compression stockings, and sling/immobilizer. Pt/spouse instructed in polar care mgt, compression stockings mgt, sling/immobilizer mgt, PROM vs AROM exercises for RUE (with instructions for no shoulder exercises until full sensation has returned), RUE precautions, adaptive strategies for bathing/dressing/toileting/grooming, positioning and considerations for sleep, and home/routines modifications to maximize falls prevention, safety, and independence. Handout provided. OT adjusted sling/immobilizer and polar care to improve comfort, optimize positioning, and to maximize skin integrity/safety. Pt/spouse verbalized understanding of all education/training provided. Pt will benefit from additional skilled therapy as recommended by the surgeon for additional rehabilitation of the shoulder.      Recommendations for follow up therapy are one component of a multi-disciplinary discharge planning process, led by the attending physician.  Recommendations may be updated based on patient status, additional  functional criteria and insurance authorization.   Follow Up Recommendations  Follow physician's recommendations for discharge plan and follow up therapies    Assistance Recommended at Discharge PRN  Patient can return home with the following A little help with walking and/or transfers;A little help with bathing/dressing/bathroom;Assistance with cooking/housework;Assist for transportation;Help with stairs or ramp for entrance    Functional Status Assessment  Patient has had a recent decline in their functional status and demonstrates the ability to make significant improvements in function in a reasonable and predictable amount of time.  Equipment Recommendations  None recommended by OT    Recommendations for Other Services       Precautions / Restrictions Precautions Precautions: Shoulder;Fall Precaution Booklet Issued: Yes (comment) Required Braces or Orthoses: Sling Restrictions Weight Bearing Restrictions: Yes RUE Weight Bearing: Non weight bearing      Mobility Bed Mobility Overal bed mobility: Modified Independent                  Transfers Overall transfer level: Needs assistance Equipment used: None Transfers: Sit to/from Stand Sit to Stand: Supervision, Min guard           General transfer comment: improved with repetition      Balance Overall balance assessment: Needs assistance Sitting-balance support: No upper extremity supported, Feet supported Sitting balance-Leahy Scale: Normal     Standing balance support: No upper extremity supported, During functional activity Standing balance-Leahy Scale: Fair                             ADL either performed or assessed with clinical judgement   ADL Overall ADL's : Needs assistance/impaired  General ADL Comments: Pt requires MIN A for LB ADL and MOD A for UB ADL 2/2 immobilized RUE. SBA for ADL transfers. MAX A for shoulder sling and  polar care mgt. Spouse able to provide level of assist. Pt ambulated to the bathroom with CGA-SBA and completed standing toileting tasks with SBA.     Vision         Perception     Praxis      Pertinent Vitals/Pain Pain Assessment Pain Assessment: No/denies pain     Hand Dominance Right   Extremity/Trunk Assessment Upper Extremity Assessment Upper Extremity Assessment: RUE deficits/detail RUE Deficits / Details: s/p reverse TSA RUE: Unable to fully assess due to immobilization RUE Sensation: decreased light touch;decreased proprioception RUE Coordination: decreased fine motor;decreased gross motor   Lower Extremity Assessment Lower Extremity Assessment: Overall WFL for tasks assessed (hx R knee pain)   Cervical / Trunk Assessment Cervical / Trunk Assessment: Normal   Communication Communication Communication: No difficulties   Cognition Arousal/Alertness: Awake/alert Behavior During Therapy: WFL for tasks assessed/performed Overall Cognitive Status: Within Functional Limits for tasks assessed                                       General Comments       Exercises Other Exercises Other Exercises: Pt/spouse instructed in shoulder precautions and how to maintain during ADL/mobility, shoulder sling and polar care mgt, falls prevention, positioning of RUE at rest and when in and out of sling, and adaptive strategies for ADL. Polar care and sling removed and repositioned to improve positioning and comfort. Spouse took pictures of both in place for future reference. Spouse assisted in donning shirt, underwear, pants, and shoes. Pt/spouse also educated in compression stocking mgt. Handout provided to support recall and carryover.   Shoulder Instructions      Home Living Family/patient expects to be discharged to:: Private residence Living Arrangements: Spouse/significant other Available Help at Discharge: Family;Available 24 hours/day Type of Home:  House Home Access: Stairs to enter [employer] of Steps: 2 Entrance Stairs-Rails: None Home Layout: One level     Bathroom Shower/Tub: Occupational psychologist: Handicapped height     Home Equipment: Conservation officer, nature (2 wheels);Cane - single point;Shower seat - built in          Prior Functioning/Environment Prior Level of Function : Independent/Modified Independent;Driving                        OT Problem List: Decreased strength;Decreased range of motion;Impaired sensation;Impaired balance (sitting and/or standing);Decreased knowledge of use of DME or AE;Decreased knowledge of precautions;Impaired UE functional use      OT Treatment/Interventions:      OT Goals(Current goals can be found in the care plan section) Acute Rehab OT Goals Patient Stated Goal: go home OT Goal Formulation: All assessment and education complete, DC therapy  OT Frequency:      Co-evaluation              AM-PAC OT "6 Clicks" Daily Activity     Outcome Measure Help from another person eating meals?: A Little Help from another person taking care of personal grooming?: A Little Help from another person toileting, which includes using toliet, bedpan, or urinal?: A Little Help from another person bathing (including washing, rinsing, drying)?: A Lot Help from another person to put on and taking  off regular upper body clothing?: A Lot Help from another person to put on and taking off regular lower body clothing?: A Little 6 Click Score: 16   End of Session Nurse Communication: Mobility status  Activity Tolerance: Patient tolerated treatment well Patient left: in bed;with call bell/phone within reach;with family/visitor present  OT Visit Diagnosis: Other abnormalities of gait and mobility (R26.89)                Time: 2919-1660 OT Time Calculation (min): 43 min Charges:  OT General Charges $OT Visit: 1 Visit OT Evaluation $OT Eval Moderate Complexity: 1 Mod OT  Treatments $Self Care/Home Management : 23-37 mins  Ardeth Perfect., MPH, MS, OTR/L ascom [phone] 05/08/21, 2:13 PM

## 2021-05-08 NOTE — Anesthesia Procedure Notes (Addendum)
Procedure Name: Intubation Date/Time: 05/08/2021 7:39 AM Performed by: Kelton Pillar, CRNA Pre-anesthesia Checklist: Patient identified, Emergency Drugs available, Suction available and Patient being monitored Patient Re-evaluated:Patient Re-evaluated prior to induction Oxygen Delivery Method: Circle system utilized Preoxygenation: Pre-oxygenation with 100% oxygen Induction Type: IV induction Ventilation: Mask ventilation without difficulty Laryngoscope Size: McGraph and 3 Grade View: Grade I Tube type: Oral Number of attempts: 1 Airway Equipment and Method: Stylet and Oral airway Placement Confirmation: ETT inserted through vocal cords under direct vision, positive ETCO2, breath sounds checked- equal and bilateral and CO2 detector Secured at: 21 cm Tube secured with: Tape Dental Injury: Teeth and Oropharynx as per pre-operative assessment

## 2021-05-09 ENCOUNTER — Encounter: Payer: Self-pay | Admitting: Surgery

## 2021-05-10 DIAGNOSIS — H2513 Age-related nuclear cataract, bilateral: Secondary | ICD-10-CM | POA: Diagnosis not present

## 2021-05-10 DIAGNOSIS — Z96652 Presence of left artificial knee joint: Secondary | ICD-10-CM | POA: Diagnosis not present

## 2021-05-10 DIAGNOSIS — Z7982 Long term (current) use of aspirin: Secondary | ICD-10-CM | POA: Diagnosis not present

## 2021-05-10 DIAGNOSIS — Z96612 Presence of left artificial shoulder joint: Secondary | ICD-10-CM | POA: Diagnosis not present

## 2021-05-10 DIAGNOSIS — J309 Allergic rhinitis, unspecified: Secondary | ICD-10-CM | POA: Diagnosis not present

## 2021-05-10 DIAGNOSIS — M8580 Other specified disorders of bone density and structure, unspecified site: Secondary | ICD-10-CM | POA: Diagnosis not present

## 2021-05-10 DIAGNOSIS — Z96611 Presence of right artificial shoulder joint: Secondary | ICD-10-CM | POA: Diagnosis not present

## 2021-05-10 DIAGNOSIS — H524 Presbyopia: Secondary | ICD-10-CM | POA: Diagnosis not present

## 2021-05-10 DIAGNOSIS — Z471 Aftercare following joint replacement surgery: Secondary | ICD-10-CM | POA: Diagnosis not present

## 2021-05-10 DIAGNOSIS — Z8546 Personal history of malignant neoplasm of prostate: Secondary | ICD-10-CM | POA: Diagnosis not present

## 2021-05-10 DIAGNOSIS — I10 Essential (primary) hypertension: Secondary | ICD-10-CM | POA: Diagnosis not present

## 2021-05-10 DIAGNOSIS — E785 Hyperlipidemia, unspecified: Secondary | ICD-10-CM | POA: Diagnosis not present

## 2021-05-10 DIAGNOSIS — H52203 Unspecified astigmatism, bilateral: Secondary | ICD-10-CM | POA: Diagnosis not present

## 2021-05-10 DIAGNOSIS — I2510 Atherosclerotic heart disease of native coronary artery without angina pectoris: Secondary | ICD-10-CM | POA: Diagnosis not present

## 2021-05-10 DIAGNOSIS — Z9181 History of falling: Secondary | ICD-10-CM | POA: Diagnosis not present

## 2021-05-12 DIAGNOSIS — M8580 Other specified disorders of bone density and structure, unspecified site: Secondary | ICD-10-CM | POA: Diagnosis not present

## 2021-05-12 DIAGNOSIS — Z8546 Personal history of malignant neoplasm of prostate: Secondary | ICD-10-CM | POA: Diagnosis not present

## 2021-05-12 DIAGNOSIS — Z96611 Presence of right artificial shoulder joint: Secondary | ICD-10-CM | POA: Diagnosis not present

## 2021-05-12 DIAGNOSIS — H2513 Age-related nuclear cataract, bilateral: Secondary | ICD-10-CM | POA: Diagnosis not present

## 2021-05-12 DIAGNOSIS — I2510 Atherosclerotic heart disease of native coronary artery without angina pectoris: Secondary | ICD-10-CM | POA: Diagnosis not present

## 2021-05-12 DIAGNOSIS — E785 Hyperlipidemia, unspecified: Secondary | ICD-10-CM | POA: Diagnosis not present

## 2021-05-12 DIAGNOSIS — J309 Allergic rhinitis, unspecified: Secondary | ICD-10-CM | POA: Diagnosis not present

## 2021-05-12 DIAGNOSIS — I10 Essential (primary) hypertension: Secondary | ICD-10-CM | POA: Diagnosis not present

## 2021-05-12 DIAGNOSIS — H52203 Unspecified astigmatism, bilateral: Secondary | ICD-10-CM | POA: Diagnosis not present

## 2021-05-12 DIAGNOSIS — H524 Presbyopia: Secondary | ICD-10-CM | POA: Diagnosis not present

## 2021-05-12 DIAGNOSIS — Z96652 Presence of left artificial knee joint: Secondary | ICD-10-CM | POA: Diagnosis not present

## 2021-05-12 DIAGNOSIS — Z7982 Long term (current) use of aspirin: Secondary | ICD-10-CM | POA: Diagnosis not present

## 2021-05-12 DIAGNOSIS — Z96612 Presence of left artificial shoulder joint: Secondary | ICD-10-CM | POA: Diagnosis not present

## 2021-05-12 DIAGNOSIS — Z471 Aftercare following joint replacement surgery: Secondary | ICD-10-CM | POA: Diagnosis not present

## 2021-05-12 DIAGNOSIS — Z9181 History of falling: Secondary | ICD-10-CM | POA: Diagnosis not present

## 2021-05-12 LAB — SURGICAL PATHOLOGY

## 2021-05-13 DIAGNOSIS — I10 Essential (primary) hypertension: Secondary | ICD-10-CM | POA: Diagnosis not present

## 2021-05-13 DIAGNOSIS — H52203 Unspecified astigmatism, bilateral: Secondary | ICD-10-CM | POA: Diagnosis not present

## 2021-05-13 DIAGNOSIS — M8580 Other specified disorders of bone density and structure, unspecified site: Secondary | ICD-10-CM | POA: Diagnosis not present

## 2021-05-13 DIAGNOSIS — Z96652 Presence of left artificial knee joint: Secondary | ICD-10-CM | POA: Diagnosis not present

## 2021-05-13 DIAGNOSIS — E785 Hyperlipidemia, unspecified: Secondary | ICD-10-CM | POA: Diagnosis not present

## 2021-05-13 DIAGNOSIS — Z9181 History of falling: Secondary | ICD-10-CM | POA: Diagnosis not present

## 2021-05-13 DIAGNOSIS — H524 Presbyopia: Secondary | ICD-10-CM | POA: Diagnosis not present

## 2021-05-13 DIAGNOSIS — Z7982 Long term (current) use of aspirin: Secondary | ICD-10-CM | POA: Diagnosis not present

## 2021-05-13 DIAGNOSIS — Z96611 Presence of right artificial shoulder joint: Secondary | ICD-10-CM | POA: Diagnosis not present

## 2021-05-13 DIAGNOSIS — Z8546 Personal history of malignant neoplasm of prostate: Secondary | ICD-10-CM | POA: Diagnosis not present

## 2021-05-13 DIAGNOSIS — Z471 Aftercare following joint replacement surgery: Secondary | ICD-10-CM | POA: Diagnosis not present

## 2021-05-13 DIAGNOSIS — J309 Allergic rhinitis, unspecified: Secondary | ICD-10-CM | POA: Diagnosis not present

## 2021-05-13 DIAGNOSIS — I2510 Atherosclerotic heart disease of native coronary artery without angina pectoris: Secondary | ICD-10-CM | POA: Diagnosis not present

## 2021-05-13 DIAGNOSIS — H2513 Age-related nuclear cataract, bilateral: Secondary | ICD-10-CM | POA: Diagnosis not present

## 2021-05-13 DIAGNOSIS — Z96612 Presence of left artificial shoulder joint: Secondary | ICD-10-CM | POA: Diagnosis not present

## 2021-05-14 DIAGNOSIS — Z96652 Presence of left artificial knee joint: Secondary | ICD-10-CM | POA: Diagnosis not present

## 2021-05-14 DIAGNOSIS — Z96611 Presence of right artificial shoulder joint: Secondary | ICD-10-CM | POA: Diagnosis not present

## 2021-05-14 DIAGNOSIS — I2510 Atherosclerotic heart disease of native coronary artery without angina pectoris: Secondary | ICD-10-CM | POA: Diagnosis not present

## 2021-05-14 DIAGNOSIS — H524 Presbyopia: Secondary | ICD-10-CM | POA: Diagnosis not present

## 2021-05-14 DIAGNOSIS — Z8546 Personal history of malignant neoplasm of prostate: Secondary | ICD-10-CM | POA: Diagnosis not present

## 2021-05-14 DIAGNOSIS — E785 Hyperlipidemia, unspecified: Secondary | ICD-10-CM | POA: Diagnosis not present

## 2021-05-14 DIAGNOSIS — Z96612 Presence of left artificial shoulder joint: Secondary | ICD-10-CM | POA: Diagnosis not present

## 2021-05-14 DIAGNOSIS — Z7982 Long term (current) use of aspirin: Secondary | ICD-10-CM | POA: Diagnosis not present

## 2021-05-14 DIAGNOSIS — H2513 Age-related nuclear cataract, bilateral: Secondary | ICD-10-CM | POA: Diagnosis not present

## 2021-05-14 DIAGNOSIS — H52203 Unspecified astigmatism, bilateral: Secondary | ICD-10-CM | POA: Diagnosis not present

## 2021-05-14 DIAGNOSIS — I10 Essential (primary) hypertension: Secondary | ICD-10-CM | POA: Diagnosis not present

## 2021-05-14 DIAGNOSIS — Z471 Aftercare following joint replacement surgery: Secondary | ICD-10-CM | POA: Diagnosis not present

## 2021-05-14 DIAGNOSIS — J309 Allergic rhinitis, unspecified: Secondary | ICD-10-CM | POA: Diagnosis not present

## 2021-05-14 DIAGNOSIS — Z9181 History of falling: Secondary | ICD-10-CM | POA: Diagnosis not present

## 2021-05-14 DIAGNOSIS — M8580 Other specified disorders of bone density and structure, unspecified site: Secondary | ICD-10-CM | POA: Diagnosis not present

## 2021-05-15 DIAGNOSIS — Z7982 Long term (current) use of aspirin: Secondary | ICD-10-CM | POA: Diagnosis not present

## 2021-05-15 DIAGNOSIS — H2513 Age-related nuclear cataract, bilateral: Secondary | ICD-10-CM | POA: Diagnosis not present

## 2021-05-15 DIAGNOSIS — J309 Allergic rhinitis, unspecified: Secondary | ICD-10-CM | POA: Diagnosis not present

## 2021-05-15 DIAGNOSIS — Z96652 Presence of left artificial knee joint: Secondary | ICD-10-CM | POA: Diagnosis not present

## 2021-05-15 DIAGNOSIS — M8580 Other specified disorders of bone density and structure, unspecified site: Secondary | ICD-10-CM | POA: Diagnosis not present

## 2021-05-15 DIAGNOSIS — Z8546 Personal history of malignant neoplasm of prostate: Secondary | ICD-10-CM | POA: Diagnosis not present

## 2021-05-15 DIAGNOSIS — I10 Essential (primary) hypertension: Secondary | ICD-10-CM | POA: Diagnosis not present

## 2021-05-15 DIAGNOSIS — Z9181 History of falling: Secondary | ICD-10-CM | POA: Diagnosis not present

## 2021-05-15 DIAGNOSIS — Z96612 Presence of left artificial shoulder joint: Secondary | ICD-10-CM | POA: Diagnosis not present

## 2021-05-15 DIAGNOSIS — Z471 Aftercare following joint replacement surgery: Secondary | ICD-10-CM | POA: Diagnosis not present

## 2021-05-15 DIAGNOSIS — H52203 Unspecified astigmatism, bilateral: Secondary | ICD-10-CM | POA: Diagnosis not present

## 2021-05-15 DIAGNOSIS — H524 Presbyopia: Secondary | ICD-10-CM | POA: Diagnosis not present

## 2021-05-15 DIAGNOSIS — I2510 Atherosclerotic heart disease of native coronary artery without angina pectoris: Secondary | ICD-10-CM | POA: Diagnosis not present

## 2021-05-15 DIAGNOSIS — Z96611 Presence of right artificial shoulder joint: Secondary | ICD-10-CM | POA: Diagnosis not present

## 2021-05-15 DIAGNOSIS — E785 Hyperlipidemia, unspecified: Secondary | ICD-10-CM | POA: Diagnosis not present

## 2021-05-16 DIAGNOSIS — J309 Allergic rhinitis, unspecified: Secondary | ICD-10-CM | POA: Diagnosis not present

## 2021-05-16 DIAGNOSIS — M8580 Other specified disorders of bone density and structure, unspecified site: Secondary | ICD-10-CM | POA: Diagnosis not present

## 2021-05-16 DIAGNOSIS — H524 Presbyopia: Secondary | ICD-10-CM | POA: Diagnosis not present

## 2021-05-16 DIAGNOSIS — I10 Essential (primary) hypertension: Secondary | ICD-10-CM | POA: Diagnosis not present

## 2021-05-16 DIAGNOSIS — H2513 Age-related nuclear cataract, bilateral: Secondary | ICD-10-CM | POA: Diagnosis not present

## 2021-05-16 DIAGNOSIS — Z9181 History of falling: Secondary | ICD-10-CM | POA: Diagnosis not present

## 2021-05-16 DIAGNOSIS — Z8546 Personal history of malignant neoplasm of prostate: Secondary | ICD-10-CM | POA: Diagnosis not present

## 2021-05-16 DIAGNOSIS — Z96612 Presence of left artificial shoulder joint: Secondary | ICD-10-CM | POA: Diagnosis not present

## 2021-05-16 DIAGNOSIS — H52203 Unspecified astigmatism, bilateral: Secondary | ICD-10-CM | POA: Diagnosis not present

## 2021-05-16 DIAGNOSIS — E785 Hyperlipidemia, unspecified: Secondary | ICD-10-CM | POA: Diagnosis not present

## 2021-05-16 DIAGNOSIS — I2510 Atherosclerotic heart disease of native coronary artery without angina pectoris: Secondary | ICD-10-CM | POA: Diagnosis not present

## 2021-05-16 DIAGNOSIS — Z7982 Long term (current) use of aspirin: Secondary | ICD-10-CM | POA: Diagnosis not present

## 2021-05-16 DIAGNOSIS — Z96611 Presence of right artificial shoulder joint: Secondary | ICD-10-CM | POA: Diagnosis not present

## 2021-05-16 DIAGNOSIS — Z96652 Presence of left artificial knee joint: Secondary | ICD-10-CM | POA: Diagnosis not present

## 2021-05-16 DIAGNOSIS — Z471 Aftercare following joint replacement surgery: Secondary | ICD-10-CM | POA: Diagnosis not present

## 2021-05-19 DIAGNOSIS — Z96652 Presence of left artificial knee joint: Secondary | ICD-10-CM | POA: Diagnosis not present

## 2021-05-19 DIAGNOSIS — J309 Allergic rhinitis, unspecified: Secondary | ICD-10-CM | POA: Diagnosis not present

## 2021-05-19 DIAGNOSIS — M8580 Other specified disorders of bone density and structure, unspecified site: Secondary | ICD-10-CM | POA: Diagnosis not present

## 2021-05-19 DIAGNOSIS — Z9181 History of falling: Secondary | ICD-10-CM | POA: Diagnosis not present

## 2021-05-19 DIAGNOSIS — I10 Essential (primary) hypertension: Secondary | ICD-10-CM | POA: Diagnosis not present

## 2021-05-19 DIAGNOSIS — I2510 Atherosclerotic heart disease of native coronary artery without angina pectoris: Secondary | ICD-10-CM | POA: Diagnosis not present

## 2021-05-19 DIAGNOSIS — Z96612 Presence of left artificial shoulder joint: Secondary | ICD-10-CM | POA: Diagnosis not present

## 2021-05-19 DIAGNOSIS — Z471 Aftercare following joint replacement surgery: Secondary | ICD-10-CM | POA: Diagnosis not present

## 2021-05-19 DIAGNOSIS — Z7982 Long term (current) use of aspirin: Secondary | ICD-10-CM | POA: Diagnosis not present

## 2021-05-19 DIAGNOSIS — E785 Hyperlipidemia, unspecified: Secondary | ICD-10-CM | POA: Diagnosis not present

## 2021-05-19 DIAGNOSIS — H524 Presbyopia: Secondary | ICD-10-CM | POA: Diagnosis not present

## 2021-05-19 DIAGNOSIS — Z8546 Personal history of malignant neoplasm of prostate: Secondary | ICD-10-CM | POA: Diagnosis not present

## 2021-05-19 DIAGNOSIS — H52203 Unspecified astigmatism, bilateral: Secondary | ICD-10-CM | POA: Diagnosis not present

## 2021-05-19 DIAGNOSIS — Z96611 Presence of right artificial shoulder joint: Secondary | ICD-10-CM | POA: Diagnosis not present

## 2021-05-19 DIAGNOSIS — H2513 Age-related nuclear cataract, bilateral: Secondary | ICD-10-CM | POA: Diagnosis not present

## 2021-05-20 DIAGNOSIS — M8580 Other specified disorders of bone density and structure, unspecified site: Secondary | ICD-10-CM | POA: Diagnosis not present

## 2021-05-20 DIAGNOSIS — E785 Hyperlipidemia, unspecified: Secondary | ICD-10-CM | POA: Diagnosis not present

## 2021-05-20 DIAGNOSIS — Z9181 History of falling: Secondary | ICD-10-CM | POA: Diagnosis not present

## 2021-05-20 DIAGNOSIS — Z7982 Long term (current) use of aspirin: Secondary | ICD-10-CM | POA: Diagnosis not present

## 2021-05-20 DIAGNOSIS — H2513 Age-related nuclear cataract, bilateral: Secondary | ICD-10-CM | POA: Diagnosis not present

## 2021-05-20 DIAGNOSIS — Z8546 Personal history of malignant neoplasm of prostate: Secondary | ICD-10-CM | POA: Diagnosis not present

## 2021-05-20 DIAGNOSIS — J309 Allergic rhinitis, unspecified: Secondary | ICD-10-CM | POA: Diagnosis not present

## 2021-05-20 DIAGNOSIS — Z96652 Presence of left artificial knee joint: Secondary | ICD-10-CM | POA: Diagnosis not present

## 2021-05-20 DIAGNOSIS — I2510 Atherosclerotic heart disease of native coronary artery without angina pectoris: Secondary | ICD-10-CM | POA: Diagnosis not present

## 2021-05-20 DIAGNOSIS — Z96611 Presence of right artificial shoulder joint: Secondary | ICD-10-CM | POA: Diagnosis not present

## 2021-05-20 DIAGNOSIS — Z96612 Presence of left artificial shoulder joint: Secondary | ICD-10-CM | POA: Diagnosis not present

## 2021-05-20 DIAGNOSIS — I10 Essential (primary) hypertension: Secondary | ICD-10-CM | POA: Diagnosis not present

## 2021-05-20 DIAGNOSIS — H524 Presbyopia: Secondary | ICD-10-CM | POA: Diagnosis not present

## 2021-05-20 DIAGNOSIS — H52203 Unspecified astigmatism, bilateral: Secondary | ICD-10-CM | POA: Diagnosis not present

## 2021-05-20 DIAGNOSIS — Z471 Aftercare following joint replacement surgery: Secondary | ICD-10-CM | POA: Diagnosis not present

## 2021-05-21 DIAGNOSIS — Z8546 Personal history of malignant neoplasm of prostate: Secondary | ICD-10-CM | POA: Diagnosis not present

## 2021-05-21 DIAGNOSIS — E785 Hyperlipidemia, unspecified: Secondary | ICD-10-CM | POA: Diagnosis not present

## 2021-05-21 DIAGNOSIS — Z471 Aftercare following joint replacement surgery: Secondary | ICD-10-CM | POA: Diagnosis not present

## 2021-05-21 DIAGNOSIS — Z96612 Presence of left artificial shoulder joint: Secondary | ICD-10-CM | POA: Diagnosis not present

## 2021-05-21 DIAGNOSIS — M8580 Other specified disorders of bone density and structure, unspecified site: Secondary | ICD-10-CM | POA: Diagnosis not present

## 2021-05-21 DIAGNOSIS — Z9181 History of falling: Secondary | ICD-10-CM | POA: Diagnosis not present

## 2021-05-21 DIAGNOSIS — Z96611 Presence of right artificial shoulder joint: Secondary | ICD-10-CM | POA: Diagnosis not present

## 2021-05-21 DIAGNOSIS — H52203 Unspecified astigmatism, bilateral: Secondary | ICD-10-CM | POA: Diagnosis not present

## 2021-05-21 DIAGNOSIS — H2513 Age-related nuclear cataract, bilateral: Secondary | ICD-10-CM | POA: Diagnosis not present

## 2021-05-21 DIAGNOSIS — Z96652 Presence of left artificial knee joint: Secondary | ICD-10-CM | POA: Diagnosis not present

## 2021-05-21 DIAGNOSIS — J309 Allergic rhinitis, unspecified: Secondary | ICD-10-CM | POA: Diagnosis not present

## 2021-05-21 DIAGNOSIS — Z7982 Long term (current) use of aspirin: Secondary | ICD-10-CM | POA: Diagnosis not present

## 2021-05-21 DIAGNOSIS — I2510 Atherosclerotic heart disease of native coronary artery without angina pectoris: Secondary | ICD-10-CM | POA: Diagnosis not present

## 2021-05-21 DIAGNOSIS — H524 Presbyopia: Secondary | ICD-10-CM | POA: Diagnosis not present

## 2021-05-21 DIAGNOSIS — I10 Essential (primary) hypertension: Secondary | ICD-10-CM | POA: Diagnosis not present

## 2021-05-22 DIAGNOSIS — Z96611 Presence of right artificial shoulder joint: Secondary | ICD-10-CM | POA: Diagnosis not present

## 2021-05-22 DIAGNOSIS — H524 Presbyopia: Secondary | ICD-10-CM | POA: Diagnosis not present

## 2021-05-22 DIAGNOSIS — I2510 Atherosclerotic heart disease of native coronary artery without angina pectoris: Secondary | ICD-10-CM | POA: Diagnosis not present

## 2021-05-22 DIAGNOSIS — M8580 Other specified disorders of bone density and structure, unspecified site: Secondary | ICD-10-CM | POA: Diagnosis not present

## 2021-05-22 DIAGNOSIS — Z96612 Presence of left artificial shoulder joint: Secondary | ICD-10-CM | POA: Diagnosis not present

## 2021-05-22 DIAGNOSIS — H52203 Unspecified astigmatism, bilateral: Secondary | ICD-10-CM | POA: Diagnosis not present

## 2021-05-22 DIAGNOSIS — E785 Hyperlipidemia, unspecified: Secondary | ICD-10-CM | POA: Diagnosis not present

## 2021-05-22 DIAGNOSIS — I10 Essential (primary) hypertension: Secondary | ICD-10-CM | POA: Diagnosis not present

## 2021-05-22 DIAGNOSIS — J309 Allergic rhinitis, unspecified: Secondary | ICD-10-CM | POA: Diagnosis not present

## 2021-05-22 DIAGNOSIS — Z96652 Presence of left artificial knee joint: Secondary | ICD-10-CM | POA: Diagnosis not present

## 2021-05-22 DIAGNOSIS — Z471 Aftercare following joint replacement surgery: Secondary | ICD-10-CM | POA: Diagnosis not present

## 2021-05-22 DIAGNOSIS — Z7982 Long term (current) use of aspirin: Secondary | ICD-10-CM | POA: Diagnosis not present

## 2021-05-22 DIAGNOSIS — H2513 Age-related nuclear cataract, bilateral: Secondary | ICD-10-CM | POA: Diagnosis not present

## 2021-05-22 DIAGNOSIS — Z9181 History of falling: Secondary | ICD-10-CM | POA: Diagnosis not present

## 2021-05-22 DIAGNOSIS — Z8546 Personal history of malignant neoplasm of prostate: Secondary | ICD-10-CM | POA: Diagnosis not present

## 2021-05-27 DIAGNOSIS — H52203 Unspecified astigmatism, bilateral: Secondary | ICD-10-CM | POA: Diagnosis not present

## 2021-05-27 DIAGNOSIS — Z7982 Long term (current) use of aspirin: Secondary | ICD-10-CM | POA: Diagnosis not present

## 2021-05-27 DIAGNOSIS — Z96652 Presence of left artificial knee joint: Secondary | ICD-10-CM | POA: Diagnosis not present

## 2021-05-27 DIAGNOSIS — E785 Hyperlipidemia, unspecified: Secondary | ICD-10-CM | POA: Diagnosis not present

## 2021-05-27 DIAGNOSIS — Z96612 Presence of left artificial shoulder joint: Secondary | ICD-10-CM | POA: Diagnosis not present

## 2021-05-27 DIAGNOSIS — Z471 Aftercare following joint replacement surgery: Secondary | ICD-10-CM | POA: Diagnosis not present

## 2021-05-27 DIAGNOSIS — I10 Essential (primary) hypertension: Secondary | ICD-10-CM | POA: Diagnosis not present

## 2021-05-27 DIAGNOSIS — J309 Allergic rhinitis, unspecified: Secondary | ICD-10-CM | POA: Diagnosis not present

## 2021-05-27 DIAGNOSIS — I2510 Atherosclerotic heart disease of native coronary artery without angina pectoris: Secondary | ICD-10-CM | POA: Diagnosis not present

## 2021-05-27 DIAGNOSIS — M8580 Other specified disorders of bone density and structure, unspecified site: Secondary | ICD-10-CM | POA: Diagnosis not present

## 2021-05-27 DIAGNOSIS — Z96611 Presence of right artificial shoulder joint: Secondary | ICD-10-CM | POA: Diagnosis not present

## 2021-05-27 DIAGNOSIS — Z9181 History of falling: Secondary | ICD-10-CM | POA: Diagnosis not present

## 2021-05-27 DIAGNOSIS — Z8546 Personal history of malignant neoplasm of prostate: Secondary | ICD-10-CM | POA: Diagnosis not present

## 2021-05-27 DIAGNOSIS — H2513 Age-related nuclear cataract, bilateral: Secondary | ICD-10-CM | POA: Diagnosis not present

## 2021-05-27 DIAGNOSIS — H524 Presbyopia: Secondary | ICD-10-CM | POA: Diagnosis not present

## 2021-05-29 DIAGNOSIS — Z96612 Presence of left artificial shoulder joint: Secondary | ICD-10-CM | POA: Diagnosis not present

## 2021-05-29 DIAGNOSIS — J309 Allergic rhinitis, unspecified: Secondary | ICD-10-CM | POA: Diagnosis not present

## 2021-05-29 DIAGNOSIS — H2513 Age-related nuclear cataract, bilateral: Secondary | ICD-10-CM | POA: Diagnosis not present

## 2021-05-29 DIAGNOSIS — H524 Presbyopia: Secondary | ICD-10-CM | POA: Diagnosis not present

## 2021-05-29 DIAGNOSIS — Z471 Aftercare following joint replacement surgery: Secondary | ICD-10-CM | POA: Diagnosis not present

## 2021-05-29 DIAGNOSIS — I10 Essential (primary) hypertension: Secondary | ICD-10-CM | POA: Diagnosis not present

## 2021-05-29 DIAGNOSIS — Z9181 History of falling: Secondary | ICD-10-CM | POA: Diagnosis not present

## 2021-05-29 DIAGNOSIS — I2510 Atherosclerotic heart disease of native coronary artery without angina pectoris: Secondary | ICD-10-CM | POA: Diagnosis not present

## 2021-05-29 DIAGNOSIS — E785 Hyperlipidemia, unspecified: Secondary | ICD-10-CM | POA: Diagnosis not present

## 2021-05-29 DIAGNOSIS — M8580 Other specified disorders of bone density and structure, unspecified site: Secondary | ICD-10-CM | POA: Diagnosis not present

## 2021-05-29 DIAGNOSIS — H52203 Unspecified astigmatism, bilateral: Secondary | ICD-10-CM | POA: Diagnosis not present

## 2021-05-29 DIAGNOSIS — Z96652 Presence of left artificial knee joint: Secondary | ICD-10-CM | POA: Diagnosis not present

## 2021-05-29 DIAGNOSIS — Z7982 Long term (current) use of aspirin: Secondary | ICD-10-CM | POA: Diagnosis not present

## 2021-05-29 DIAGNOSIS — Z96611 Presence of right artificial shoulder joint: Secondary | ICD-10-CM | POA: Diagnosis not present

## 2021-05-29 DIAGNOSIS — Z8546 Personal history of malignant neoplasm of prostate: Secondary | ICD-10-CM | POA: Diagnosis not present

## 2021-06-02 DIAGNOSIS — M25611 Stiffness of right shoulder, not elsewhere classified: Secondary | ICD-10-CM | POA: Diagnosis not present

## 2021-06-02 DIAGNOSIS — M19011 Primary osteoarthritis, right shoulder: Secondary | ICD-10-CM | POA: Diagnosis not present

## 2021-06-16 DIAGNOSIS — M25611 Stiffness of right shoulder, not elsewhere classified: Secondary | ICD-10-CM | POA: Diagnosis not present

## 2021-06-16 DIAGNOSIS — Z96611 Presence of right artificial shoulder joint: Secondary | ICD-10-CM | POA: Diagnosis not present

## 2021-06-16 DIAGNOSIS — M19011 Primary osteoarthritis, right shoulder: Secondary | ICD-10-CM | POA: Diagnosis not present

## 2021-06-19 DIAGNOSIS — M19011 Primary osteoarthritis, right shoulder: Secondary | ICD-10-CM | POA: Diagnosis not present

## 2021-06-19 DIAGNOSIS — M25611 Stiffness of right shoulder, not elsewhere classified: Secondary | ICD-10-CM | POA: Diagnosis not present

## 2021-06-23 DIAGNOSIS — M25611 Stiffness of right shoulder, not elsewhere classified: Secondary | ICD-10-CM | POA: Diagnosis not present

## 2021-06-23 DIAGNOSIS — M19011 Primary osteoarthritis, right shoulder: Secondary | ICD-10-CM | POA: Diagnosis not present

## 2021-06-25 DIAGNOSIS — M19011 Primary osteoarthritis, right shoulder: Secondary | ICD-10-CM | POA: Diagnosis not present

## 2021-06-25 DIAGNOSIS — M25611 Stiffness of right shoulder, not elsewhere classified: Secondary | ICD-10-CM | POA: Diagnosis not present

## 2021-06-30 DIAGNOSIS — M25611 Stiffness of right shoulder, not elsewhere classified: Secondary | ICD-10-CM | POA: Diagnosis not present

## 2021-06-30 DIAGNOSIS — M19011 Primary osteoarthritis, right shoulder: Secondary | ICD-10-CM | POA: Diagnosis not present

## 2021-07-02 DIAGNOSIS — M25611 Stiffness of right shoulder, not elsewhere classified: Secondary | ICD-10-CM | POA: Diagnosis not present

## 2021-07-02 DIAGNOSIS — M19011 Primary osteoarthritis, right shoulder: Secondary | ICD-10-CM | POA: Diagnosis not present

## 2021-07-07 DIAGNOSIS — M19011 Primary osteoarthritis, right shoulder: Secondary | ICD-10-CM | POA: Diagnosis not present

## 2021-07-07 DIAGNOSIS — M25611 Stiffness of right shoulder, not elsewhere classified: Secondary | ICD-10-CM | POA: Diagnosis not present

## 2021-07-09 DIAGNOSIS — M19011 Primary osteoarthritis, right shoulder: Secondary | ICD-10-CM | POA: Diagnosis not present

## 2021-07-09 DIAGNOSIS — M25611 Stiffness of right shoulder, not elsewhere classified: Secondary | ICD-10-CM | POA: Diagnosis not present

## 2021-07-21 DIAGNOSIS — M19011 Primary osteoarthritis, right shoulder: Secondary | ICD-10-CM | POA: Diagnosis not present

## 2021-07-21 DIAGNOSIS — M25611 Stiffness of right shoulder, not elsewhere classified: Secondary | ICD-10-CM | POA: Diagnosis not present

## 2021-07-26 ENCOUNTER — Other Ambulatory Visit: Payer: Self-pay | Admitting: Family

## 2021-09-10 DIAGNOSIS — H2513 Age-related nuclear cataract, bilateral: Secondary | ICD-10-CM | POA: Diagnosis not present

## 2021-09-10 DIAGNOSIS — H02834 Dermatochalasis of left upper eyelid: Secondary | ICD-10-CM | POA: Diagnosis not present

## 2021-09-10 DIAGNOSIS — R202 Paresthesia of skin: Secondary | ICD-10-CM | POA: Diagnosis not present

## 2021-09-10 DIAGNOSIS — H25043 Posterior subcapsular polar age-related cataract, bilateral: Secondary | ICD-10-CM | POA: Diagnosis not present

## 2021-09-10 DIAGNOSIS — H35373 Puckering of macula, bilateral: Secondary | ICD-10-CM | POA: Diagnosis not present

## 2021-09-10 DIAGNOSIS — C61 Malignant neoplasm of prostate: Secondary | ICD-10-CM | POA: Diagnosis not present

## 2021-09-10 DIAGNOSIS — R200 Anesthesia of skin: Secondary | ICD-10-CM | POA: Diagnosis not present

## 2021-09-10 DIAGNOSIS — H524 Presbyopia: Secondary | ICD-10-CM | POA: Diagnosis not present

## 2021-09-10 DIAGNOSIS — H02831 Dermatochalasis of right upper eyelid: Secondary | ICD-10-CM | POA: Diagnosis not present

## 2021-09-10 DIAGNOSIS — H5212 Myopia, left eye: Secondary | ICD-10-CM | POA: Diagnosis not present

## 2021-09-10 DIAGNOSIS — H43813 Vitreous degeneration, bilateral: Secondary | ICD-10-CM | POA: Diagnosis not present

## 2021-09-10 DIAGNOSIS — H11153 Pinguecula, bilateral: Secondary | ICD-10-CM | POA: Diagnosis not present

## 2021-09-10 DIAGNOSIS — H52203 Unspecified astigmatism, bilateral: Secondary | ICD-10-CM | POA: Diagnosis not present

## 2021-09-10 DIAGNOSIS — H5201 Hypermetropia, right eye: Secondary | ICD-10-CM | POA: Diagnosis not present

## 2021-09-19 DIAGNOSIS — W010XXA Fall on same level from slipping, tripping and stumbling without subsequent striking against object, initial encounter: Secondary | ICD-10-CM | POA: Diagnosis not present

## 2021-09-19 DIAGNOSIS — R0781 Pleurodynia: Secondary | ICD-10-CM | POA: Diagnosis not present

## 2021-09-19 DIAGNOSIS — R059 Cough, unspecified: Secondary | ICD-10-CM | POA: Diagnosis not present

## 2021-09-19 DIAGNOSIS — J181 Lobar pneumonia, unspecified organism: Secondary | ICD-10-CM | POA: Diagnosis not present

## 2021-09-19 DIAGNOSIS — J90 Pleural effusion, not elsewhere classified: Secondary | ICD-10-CM | POA: Diagnosis not present

## 2021-09-19 DIAGNOSIS — R509 Fever, unspecified: Secondary | ICD-10-CM | POA: Diagnosis not present

## 2021-09-19 DIAGNOSIS — R0981 Nasal congestion: Secondary | ICD-10-CM | POA: Diagnosis not present

## 2021-09-23 DIAGNOSIS — C61 Malignant neoplasm of prostate: Secondary | ICD-10-CM | POA: Diagnosis not present

## 2021-09-24 DIAGNOSIS — M25512 Pain in left shoulder: Secondary | ICD-10-CM | POA: Diagnosis not present

## 2021-09-24 DIAGNOSIS — Z96612 Presence of left artificial shoulder joint: Secondary | ICD-10-CM | POA: Diagnosis not present

## 2021-09-24 DIAGNOSIS — M19012 Primary osteoarthritis, left shoulder: Secondary | ICD-10-CM | POA: Diagnosis not present

## 2021-09-29 DIAGNOSIS — E785 Hyperlipidemia, unspecified: Secondary | ICD-10-CM | POA: Diagnosis not present

## 2021-09-30 DIAGNOSIS — E785 Hyperlipidemia, unspecified: Secondary | ICD-10-CM | POA: Diagnosis not present

## 2021-09-30 DIAGNOSIS — I10 Essential (primary) hypertension: Secondary | ICD-10-CM | POA: Diagnosis not present

## 2021-09-30 DIAGNOSIS — I25118 Atherosclerotic heart disease of native coronary artery with other forms of angina pectoris: Secondary | ICD-10-CM | POA: Diagnosis not present

## 2021-10-01 DIAGNOSIS — D485 Neoplasm of uncertain behavior of skin: Secondary | ICD-10-CM | POA: Diagnosis not present

## 2021-10-01 DIAGNOSIS — C44319 Basal cell carcinoma of skin of other parts of face: Secondary | ICD-10-CM | POA: Diagnosis not present

## 2021-10-02 ENCOUNTER — Ambulatory Visit: Payer: Medicare HMO | Admitting: Nurse Practitioner

## 2021-10-02 ENCOUNTER — Encounter: Payer: Self-pay | Admitting: Nurse Practitioner

## 2021-10-02 VITALS — BP 134/64 | HR 65 | Ht 67.0 in | Wt 187.4 lb

## 2021-10-02 DIAGNOSIS — I5042 Chronic combined systolic (congestive) and diastolic (congestive) heart failure: Secondary | ICD-10-CM

## 2021-10-02 DIAGNOSIS — I2510 Atherosclerotic heart disease of native coronary artery without angina pectoris: Secondary | ICD-10-CM | POA: Diagnosis not present

## 2021-10-02 DIAGNOSIS — I10 Essential (primary) hypertension: Secondary | ICD-10-CM

## 2021-10-02 DIAGNOSIS — E782 Mixed hyperlipidemia: Secondary | ICD-10-CM | POA: Diagnosis not present

## 2021-10-02 MED ORDER — CARVEDILOL 3.125 MG PO TABS: 3.1250 mg | ORAL_TABLET | Freq: Two times a day (BID) | ORAL | 3 refills | Status: DC

## 2021-10-02 NOTE — Progress Notes (Signed)
Office Visit    Patient Name: Larry Allen Date of Encounter: 10/02/2021  Primary Care Provider:  Jefm Petty, MD Primary Cardiologist:  Nelva Bush, MD  Chief Complaint    81 year old male with a history of hypertension, hyperlipidemia, nonischemic cardiomyopathy, heart failure with midrange ejection fraction, nonobstructive CAD, PVCs, and prostate cancer, who presents for follow-up of heart failure.  Past Medical History    Past Medical History:  Diagnosis Date   Arthritis    Chronic combined systolic (congestive) and diastolic (congestive) heart failure (HCC)    a.) TTE: 02/29/2020: EF 40-45%; global HK; PASP 37.5 mmHg; mild-mod MR; G1DD.  b.) TTE 07/30/2020: EF 45-50%; global HK; G1DD;  nl RV fxn; mildly dil LA; mod MR.   DOE (dyspnea on exertion)    Erectile dysfunction    a.) on PDE5i medication (vardenafil)   HLD (hyperlipidemia)    Hypertension    Insomnia    a.) on zolpidem   Mitral valve regurgitation    a.) TTE 02/29/2020: EF 40-45%; mild-mod MR. b.) TTE 07/30/2020: EF 45-50%; mod MR   NICM (nonischemic cardiomyopathy) ([place])    a. 02/2020 Echo: EF 40-45%; b. 07/2020 Echo: EF 45-50%.   Nonobstructive CAD (coronary artery disease)    a. 02/2020 MV: inf infarct, no ischemia; b. 02/2020 Cath: LM nl, LAD nl, D1/2/3 nl, RI nl, LCX nl, OM1/2/ nl, RCA 50p, RPDA/RPAV, RPL1/2/3 nl. LVEDP 15-87mHg-->Med rx.   Pneumonia    Prostate cancer ([place] 2016   Right bundle branch block (RBBB) with left anterior fascicular block (LAFB) 10/16/2020   Thrombocytopenia (HCC)    Past Surgical History:  Procedure Laterality Date   APPENDECTOMY     BICEPT TENODESIS Right 05/08/2021   Procedure: BICEPS TENODESIS;  Surgeon: PCorky Mull MD;  Location: [facility];  Service: Orthopedics;  Laterality: Right;   COLONOSCOPY W/ POLYPECTOMY     HERNIA REPAIR  2020   JOINT REPLACEMENT Left 2020   knee   LEFT HEART CATH AND CORONARY ANGIOGRAPHY N/A 03/12/2020   Procedure: LEFT HEART  CATH AND CORONARY ANGIOGRAPHY;  Surgeon: ENelva Bush MD;  Location: [facility];  Service: Cardiovascular;  Laterality: N/A;   REVERSE SHOULDER ARTHROPLASTY Right 05/08/2021   Procedure: REVERSE SHOULDER ARTHROPLASTY;  Surgeon: PCorky Mull MD;  Location: [facility];  Service: Orthopedics;  Laterality: Right;   TOTAL SHOULDER ARTHROPLASTY Left 04/30/2020   Procedure: TOTAL SHOULDER ARTHROPLASTY;  Surgeon: PCorky Mull MD;  Location: [facility];  Service: Orthopedics;  Laterality: Left;    Allergies  Allergies  Allergen Reactions   Amoxicillin Swelling and Rash    Tolerated 1st generation cephalosporin (CEFAZOLIN) on 01/16/2019, 03/03/2019, and 04/30/2020 with no documented ADRs.       History of Present Illness    81year old male with the above past medical history including hypertension, hyperlipidemia, nonischemic cardiomyopathy, heart failure with midrange ejection fraction, nonobstructive CAD, PVCs, and prostate cancer.  He was previously evaluated in December 2021 in the setting of abnormal EKG prior to left total shoulder arthroplasty.  He reported dyspnea on exertion and underwent echocardiography, which showed an EF of 40 to 45% with global hypokinesis, grade 1 diastolic dysfunction, mild to moderate MR.  Lexiscan Myoview was performed and showed a small defect of moderate severity in the apical inferior location, suggestive of prior infarct.  Mild coronary calcifications were also noted.  He underwent diagnostic catheterization which showed a 50% proximal RCA stenosis and otherwise normal coronary arteries.  He has since  been medically managed.  Follow-up echocardiogram May 2022, showed slight improvement in EF to 45 to 50% with global hypokinesis, grade 1 diastolic dysfunction, and moderate mitral regurgitation.  Larry Allen was last seen in cardiology clinic in January 2023, at which time he presented for preoperative evaluation pending right shoulder surgery.  He has  since undergone right shoulder surgery, and had an uneventful postoperative course.  He still occasionally notes some discomfort in the right shoulder but overall, is recovering well and is back to playing golf at least 1 day a week, typically very early in the morning given the intense heat during the day.  He has not required a dose of Lasix over the past 6 months.  He denies chest pain, dyspnea, palpitations, PND, orthopnea, dizziness, syncope, edema, or early satiety.  Home Medications    Current Outpatient Medications  Medication Sig Dispense Refill   acetaminophen (TYLENOL) 500 MG tablet Take 500 mg by mouth every 6 (six) hours as needed for moderate pain or mild pain (for pain.).     aspirin EC 81 MG tablet Take 81 mg by mouth daily. Swallow whole.     calcium carbonate (OSCAL) 1500 (600 Ca) MG TABS tablet Take 600 mg by mouth daily.     carvedilol (COREG) 3.125 MG tablet TAKE 1 TABLET (3.125 MG TOTAL) BY MOUTH 2 (TWO) TIMES DAILY WITH A MEAL. 180 tablet 3   cetirizine (ZYRTEC) 10 MG tablet Take 10 mg by mouth at bedtime.     cholecalciferol (VITAMIN D) 25 MCG (1000 UNIT) tablet Take 1,000 Units by mouth daily.     Coenzyme Q10 (COQ10) 200 MG CAPS Take 200 mg by mouth at bedtime.     docusate sodium (COLACE) 50 MG capsule Take 50 mg by mouth daily.     fluticasone (FLONASE) 50 MCG/ACT nasal spray Place 1 spray into both nostrils 2 (two) times daily as needed.     furosemide (LASIX) 20 MG tablet TAKE 1 TABLET (20 MG TOTAL) BY MOUTH DAILY AS NEEDED FOR FLUID (AS NEEDED FOR WEIGHT GAIN OF 2 POUNDS OVERNIGHT OR 5 POUNDS IN ONE WEEK.). TAKE FOR 3 CONSECUTIVE DAYS, THEN AS NEEDED 90 tablet 0   lisinopril-hydrochlorothiazide (ZESTORETIC) 20-12.5 MG tablet Take 1 tablet by mouth daily.     Multiple Vitamin (MULTIVITAMIN WITH MINERALS) TABS tablet Take 1 tablet by mouth daily. 50 +     OVER THE COUNTER MEDICATION Take 1-2 capsules by mouth See admin instructions. Balance of Larry Allen take 2  Capsule in the morning and 1 capsule at bedtime     OVER THE COUNTER MEDICATION Take 1-2 capsules by mouth See admin instructions. Balance of Nature Fruit  Take 2 capsules in the morning and 1 capsule at bedtime     polyethylene glycol (MIRALAX / GLYCOLAX) 17 g packet Take 17 g by mouth daily.     Probiotic Product (PROBIOTIC DAILY) CAPS Take 1 capsule by mouth daily.     rosuvastatin (CRESTOR) 20 MG tablet Take 20 mg by mouth at bedtime.     vardenafil (LEVITRA) 20 MG tablet Take 20 mg by mouth daily as needed for erectile dysfunction.     zolpidem (AMBIEN) 5 MG tablet Take 5 mg by mouth at bedtime as needed for sleep.     oxyCODONE (ROXICODONE) 5 MG immediate release tablet Take 1-2 tablets (5-10 mg total) by mouth every 4 (four) hours as needed for moderate pain or severe pain. (Patient not taking: Reported on 10/02/2021) 40 tablet 0  No current facility-administered medications for this visit.     Review of Systems    Feeling well.  He denies chest pain, palpitations, dyspnea, pnd, orthopnea, n, v, dizziness, syncope, edema, weight gain, or early satiety.  All other systems reviewed and are otherwise negative except as noted above.    Physical Exam    VS:  BP 140/60 (BP Location: Left Arm, Patient Position: Sitting, Cuff Size: Normal)   Pulse 65   Ht '5\' 7"'$  (1.702 m)   Wt 187 lb 6 oz (85 kg)   SpO2 98%   BMI 29.35 kg/m  , BMI Body mass index is 29.35 kg/m.     Vitals:   10/02/21 1132 10/02/21 1216  BP: 140/60 134/64  Pulse: 65   SpO2: 98%     GEN: Well nourished, well developed, in no acute distress. HEENT: normal. Neck: Supple, no JVD, carotid bruits, or masses. Cardiac: RRR, no murmurs, rubs, or gallops. No clubbing, cyanosis, edema.  Radials/PT 2+ and equal bilaterally.  Respiratory:  Respirations regular and unlabored, clear to auscultation bilaterally. GI: Soft, nontender, nondistended, BS + x 4. MS: no deformity or atrophy. Skin: warm and dry, no rash. Neuro:   Strength and sensation are intact. Psych: Normal affect.  Accessory Clinical Findings    ECG personally reviewed by me today -regular sinus rhythm, 65, PVC, left axis deviation, right bundle branch block, septal infarct- no acute changes.  Lab Results  Component Value Date   WBC 6.0 04/25/2021   HGB 13.8 04/25/2021   HCT 41.9 04/25/2021   MCV 95.0 04/25/2021   PLT 157 04/25/2021   Lab Results  Component Value Date   CREATININE 0.65 04/25/2021   BUN 18 04/25/2021   NA 141 04/25/2021   K 3.6 04/25/2021   CL 103 04/25/2021   CO2 30 04/25/2021   Lab Results  Component Value Date   ALT 17 04/25/2021   AST 20 04/25/2021   ALKPHOS 64 04/25/2021   BILITOT 0.5 04/25/2021   Labs dated September 29, 2021 from care everywhere  Total cholesterol 105, triglycerides 53, HDL 38, LDL 62  Assessment & Plan    1.  Heart failure with midrange ejection fraction/nonischemic cardiomyopathy: Most recent echo in May 2022 showed an EF of 45 to 50%.  Prior diagnostic catheterization showed moderate, nonobstructive RCA disease.  He has been doing well over the past 6 months without symptoms or limitations.  Heart rate and blood pressure stable today.  He is euvolemic on examination.  He remains on beta-blocker and ACE inhibitor therapy.  He has Lasix to be used as needed but has not required a dose in quite some time.  2.  Nonobstructive CAD.  Diagnostic catheterization in December 2021 showed a 50% proximal RCA stenosis with otherwise normal coronary arteries.  He remains active without chest pain or dyspnea.  Continue aspirin, statin, and beta-blocker therapy.  3.  Essential hypertension: Blood pressure initially elevated on arrival however repeat improved at 134/64.  He does check his blood pressure regularly at home and notes that he typically trends in the 1 teens.  Continue current doses of beta-blocker and ACE inhibitor therapy.  4.  Hyperlipidemia: LDL of 62 by labs earlier this month.  Normal LFTs  in February.  He remains on rosuvastatin therapy.  5.  Disposition: Follow-up in 6 months or sooner if necessary.   Murray Hodgkins, NP 10/02/2021, 11:53 AM

## 2021-10-02 NOTE — Patient Instructions (Signed)
Medication Instructions:  - Your physician recommends that you continue on your current medications as directed. Please refer to the Current Medication list given to you today.  *If you need a refill on your cardiac medications before your next appointment, please call your pharmacy*   Lab Work: - none ordered  If you have labs (blood work) drawn today and your tests are completely normal, you will receive your results only by: [place] (if you have MyChart) OR A paper copy in the mail If you have any lab test that is abnormal or we need to change your treatment, we will call you to review the results.   Testing/Procedures: - none ordered   Follow-Up: At [facility], you and your health needs are our priority.  As part of our continuing mission to provide you with exceptional heart care, we have created designated Provider Care Teams.  These Care Teams include your primary Cardiologist (physician) and Advanced Practice Providers (APPs -  Physician Assistants and Nurse Practitioners) who all work together to provide you with the care you need, when you need it.  We recommend signing up for the patient portal called "MyChart".  Sign up information is provided on this After Visit Summary.  MyChart is used to connect with patients for Virtual Visits (Telemedicine).  Patients are able to view lab/test results, encounter notes, upcoming appointments, etc.  Non-urgent messages can be sent to your provider as well.   To learn more about what you can do with MyChart, go to [email or web address].    Your next appointment:   6 month(s)  The format for your next appointment:   In Person  Provider:   You may see Nelva Bush, MD or one of the following Advanced Practice Providers on your designated Care Team:   Murray Hodgkins, NP    Other Instructions N/a  Important Information About Sugar

## 2021-10-23 ENCOUNTER — Other Ambulatory Visit: Payer: Self-pay | Admitting: Internal Medicine

## 2021-10-30 DIAGNOSIS — H25811 Combined forms of age-related cataract, right eye: Secondary | ICD-10-CM | POA: Diagnosis not present

## 2021-10-30 DIAGNOSIS — H2511 Age-related nuclear cataract, right eye: Secondary | ICD-10-CM | POA: Diagnosis not present

## 2021-11-05 DIAGNOSIS — H25812 Combined forms of age-related cataract, left eye: Secondary | ICD-10-CM | POA: Diagnosis not present

## 2021-11-07 DIAGNOSIS — Z96612 Presence of left artificial shoulder joint: Secondary | ICD-10-CM | POA: Diagnosis not present

## 2021-11-07 DIAGNOSIS — Z96611 Presence of right artificial shoulder joint: Secondary | ICD-10-CM | POA: Diagnosis not present

## 2021-11-07 DIAGNOSIS — M19011 Primary osteoarthritis, right shoulder: Secondary | ICD-10-CM | POA: Diagnosis not present

## 2021-11-07 DIAGNOSIS — M7521 Bicipital tendinitis, right shoulder: Secondary | ICD-10-CM | POA: Diagnosis not present

## 2021-11-07 DIAGNOSIS — G5602 Carpal tunnel syndrome, left upper limb: Secondary | ICD-10-CM | POA: Diagnosis not present

## 2021-11-11 DIAGNOSIS — C44319 Basal cell carcinoma of skin of other parts of face: Secondary | ICD-10-CM | POA: Diagnosis not present

## 2021-11-27 DIAGNOSIS — H25812 Combined forms of age-related cataract, left eye: Secondary | ICD-10-CM | POA: Diagnosis not present

## 2021-11-27 DIAGNOSIS — H2512 Age-related nuclear cataract, left eye: Secondary | ICD-10-CM | POA: Diagnosis not present

## 2021-11-28 IMAGING — DX DG SHOULDER 1V*L*
2 series · 2 of 2 positions shown · non-contrast
Comparison: MR 01/18/2019

CLINICAL DATA: Status post left shoulder arthroplasty.

EXAM:
LEFT SHOULDER

[shoulder ap]
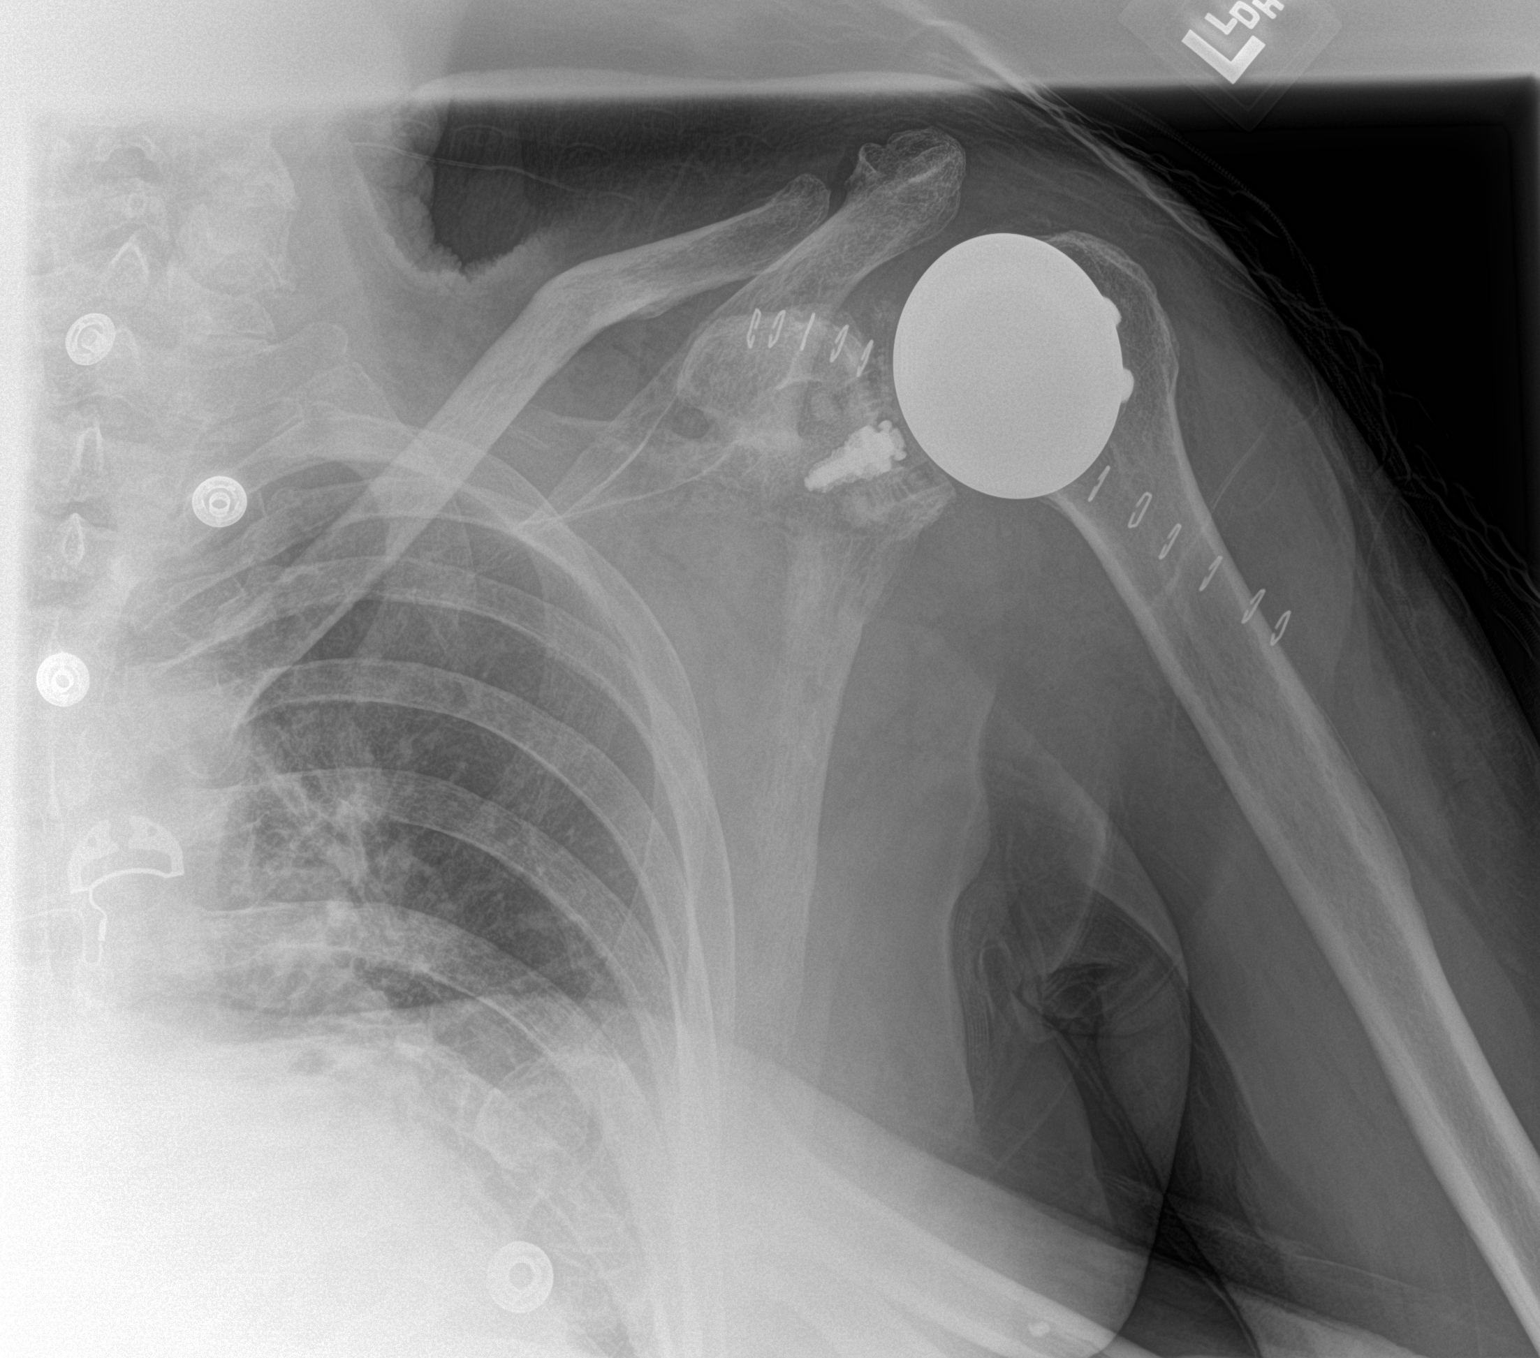

[shoulder obl]
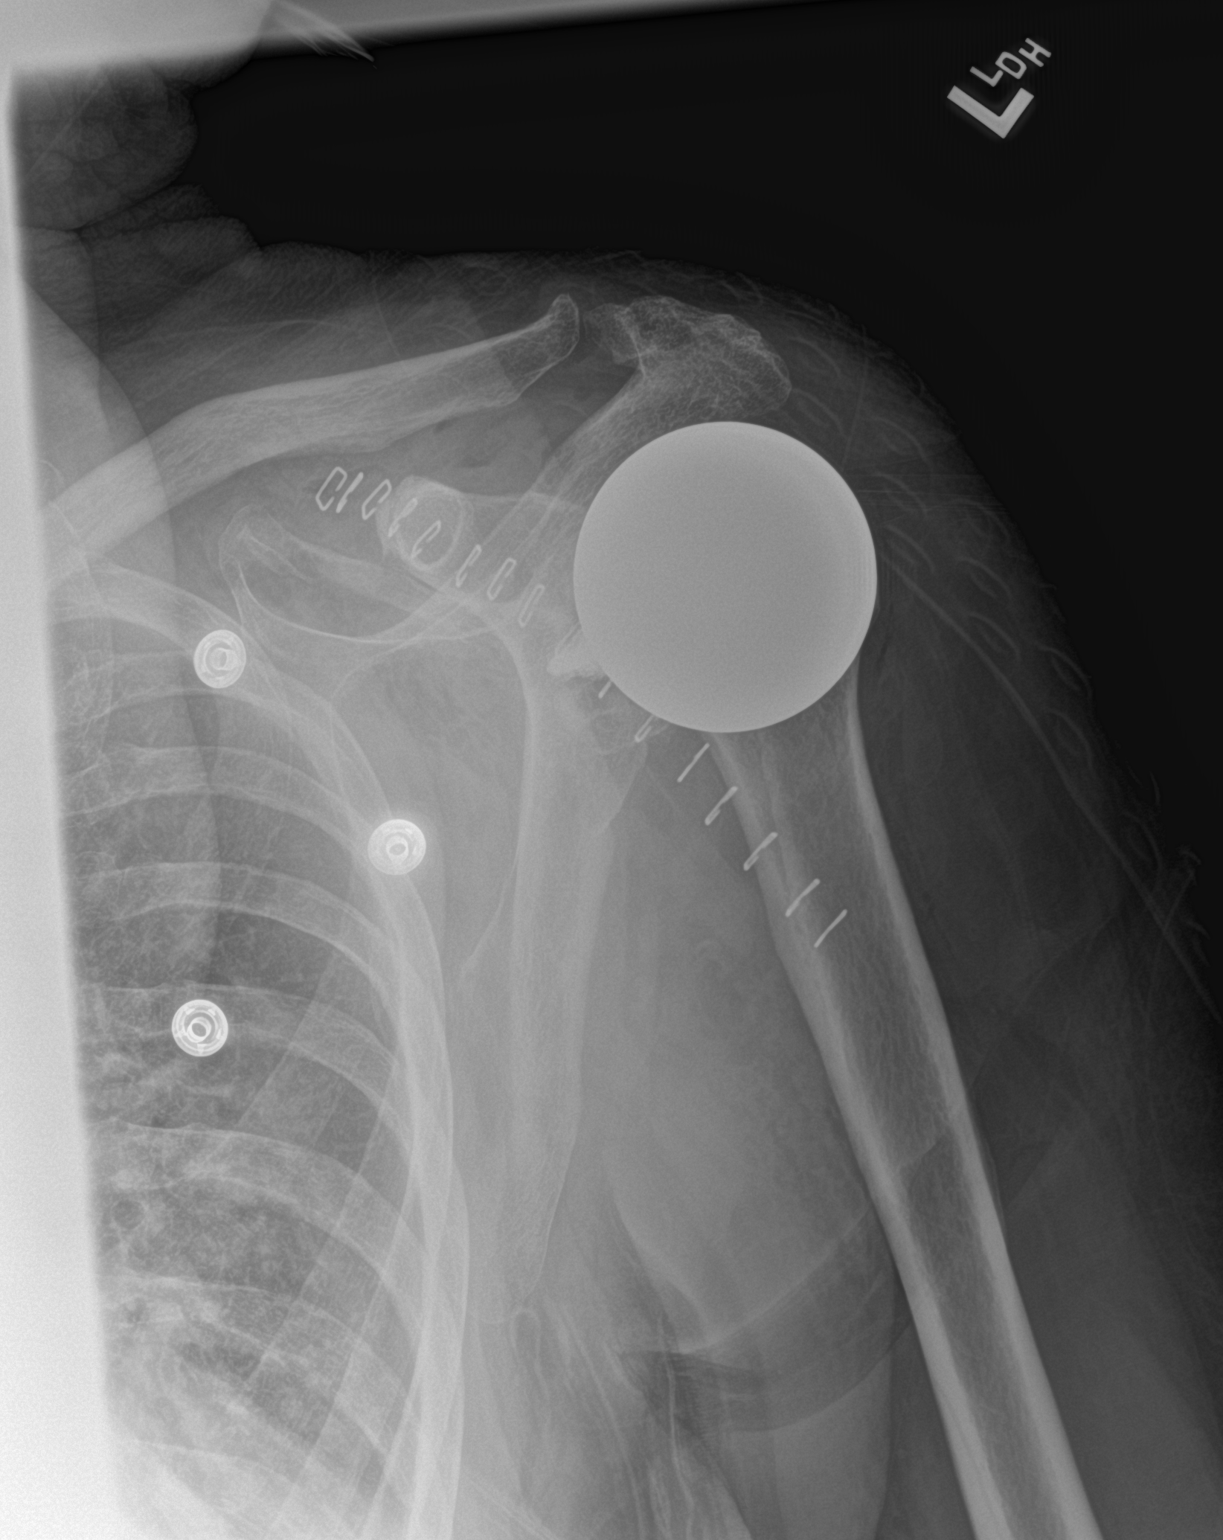

[2 of 2 positions shown; findings below may reference images not displayed]

FINDINGS: Postsurgical changes from left shoulder arthroplasty identified. The
hardware components are in anatomic alignment. No fracture or
dislocation identified. Overlying skin staples are identified.
IMPRESSION: Status post left shoulder arthroplasty.

## 2022-01-01 DIAGNOSIS — H524 Presbyopia: Secondary | ICD-10-CM | POA: Diagnosis not present

## 2022-01-15 ENCOUNTER — Other Ambulatory Visit: Payer: Self-pay | Admitting: Internal Medicine

## 2022-01-27 DIAGNOSIS — Z471 Aftercare following joint replacement surgery: Secondary | ICD-10-CM | POA: Diagnosis not present

## 2022-01-27 DIAGNOSIS — M25462 Effusion, left knee: Secondary | ICD-10-CM | POA: Diagnosis not present

## 2022-01-27 DIAGNOSIS — Z96652 Presence of left artificial knee joint: Secondary | ICD-10-CM | POA: Diagnosis not present

## 2022-04-03 ENCOUNTER — Ambulatory Visit: Payer: Medicare HMO | Attending: Internal Medicine | Admitting: Internal Medicine

## 2022-04-03 ENCOUNTER — Encounter: Payer: Self-pay | Admitting: Internal Medicine

## 2022-04-03 VITALS — BP 160/84 | HR 58 | Ht 67.0 in | Wt 187.8 lb

## 2022-04-03 DIAGNOSIS — E782 Mixed hyperlipidemia: Secondary | ICD-10-CM

## 2022-04-03 DIAGNOSIS — I10 Essential (primary) hypertension: Secondary | ICD-10-CM | POA: Diagnosis not present

## 2022-04-03 DIAGNOSIS — I2510 Atherosclerotic heart disease of native coronary artery without angina pectoris: Secondary | ICD-10-CM

## 2022-04-03 DIAGNOSIS — I5022 Chronic systolic (congestive) heart failure: Secondary | ICD-10-CM | POA: Diagnosis not present

## 2022-04-03 NOTE — Patient Instructions (Signed)
Medication Instructions:  Your Physician recommend you continue on your current medication as directed.    *If you need a refill on your cardiac medications before your next appointment, please call your pharmacy*   Lab Work: Your physician recommends that you have BMP drawn at primary care in 2 weeks    Testing/Procedures: None ordered today   Follow-Up: At [facility], you and your health needs are our priority.  As part of our continuing mission to provide you with exceptional heart care, we have created designated Provider Care Teams.  These Care Teams include your primary Cardiologist (physician) and Advanced Practice Providers (APPs -  Physician Assistants and Nurse Practitioners) who all work together to provide you with the care you need, when you need it.  We recommend signing up for the patient portal called "MyChart".  Sign up information is provided on this After Visit Summary.  MyChart is used to connect with patients for Virtual Visits (Telemedicine).  Patients are able to view lab/test results, encounter notes, upcoming appointments, etc.  Non-urgent messages can be sent to your provider as well.   To learn more about what you can do with MyChart, go to [email or web address].    Your next appointment:   6 month(s)  Provider:   You may see Nelva Bush, MD or one of the following Advanced Practice Providers on your designated Care Team:   Murray Hodgkins, NP Christell Faith, PA-C Cadence Kathlen Mody, PA-C Gerrie Nordmann, NP

## 2022-04-03 NOTE — Progress Notes (Signed)
Follow-up Outpatient Visit Date: 04/03/2022  Primary Care Provider: Jefm Petty, MD [address] [place] [address]  Chief Complaint: Follow-up coronary artery disease, ischemic cardiomyopathy and hypertension  HPI:  Larry Allen is a 82 y.o. male with history of nonobstructive coronary artery disease, nonischemic cardiomyopathy with moderately reduced LVEF by echo (40-45%), hypertension, hyperlipidemia, and prostate cancer, who presents for follow-up of cardiomyopathy and coronary artery disease.  He was last seen in our office in 09/2021 by Ignacia Bayley, NP, at which time he was still recovering from right shoulder surgery.  He was otherwise asymptomatic.  No medication changes or additional testing were pursued.  Today, Larry Allen reports that he has been feeling fairly well though he is concerned that his blood pressure has been elevated the last week.  He has had several physician appointments this week and thinks that stress related to this may be driving his high blood pressure.  Earlier this month, his blood pressures were typically running 110-130/60-70 but have been up to 443-154 systolic this week.  He reports being compliant with his medications.  He was recently advised to switch from lisinopril/HCTZ 20/12.5 to lisinopril 40 mg a day to see if that would help with his erectile dysfunction.  He rarely needs to use as needed furosemide for leg swelling or weight gain.  He denies chest pain, shortness of breath, palpitations, and lightheadedness.  He notes some indigestion since his last visit with [place], though this resolved with a 1-2-week course of omeprazole.  --------------------------------------------------------------------------------------------------  Cardiovascular History & Procedures: Cardiovascular Problems: Moderate coronary artery disease Nonischemic cardiomyopathy   Risk Factors: Hypertension, hyperlipidemia, male gender, and age greater than 48    Cath/PCI: LHC (03/12/2020): LMCA, LAD, ramus intermedius, and LCx normal.  Tortuous RCA with 50% proximal stenosis.  LVEDP 15-20 mmHg.   CV Surgery: None   EP Procedures and Devices: None   Non-Invasive Evaluation(s): TTE (07/30/2020): Normal LV size and wall thickness.  LVEF 45-50% with global hypokinesis and grade 1 diastolic dysfunction.  Elevated filling pressure noted.  Mild pulmonary hypertension with mild dilation of the right ventricle.  Mild left atrial enlargement.  Moderate mitral regurgitation and mild tricuspid regurgitation present. Pharmacologic MPI (03/04/2020): Intermediate risk study with small fixed apical inferior defect suggestive of scar.  LVEF 45-54%.  Coronary artery calcification present. TTE (02/29/2020): Normal LV size and wall thickness.  LVEF 40-45% with global hypokinesis and grade 1 diastolic dysfunction.  Normal RV size and function.  Mild pulmonary hypertension.  Mild to moderate mitral regurgitation.  Mild tricuspid regurgitation.  Mild aortic sclerosis without stenosis.  Recent CV Pertinent Labs: Lab Results  Component Value Date   K 3.6 04/25/2021   BUN 18 04/25/2021   CREATININE 0.65 04/25/2021    Past medical and surgical history were reviewed and updated in EPIC.  Current Meds  Medication Sig   acetaminophen (TYLENOL) 500 MG tablet Take 500 mg by mouth every 6 (six) hours as needed for moderate pain or mild pain (for pain.).   aspirin EC 81 MG tablet Take 81 mg by mouth daily. Swallow whole.   calcium carbonate (OSCAL) 1500 (600 Ca) MG TABS tablet Take 600 mg by mouth daily.   carvedilol (COREG) 3.125 MG tablet Take 1 tablet (3.125 mg total) by mouth 2 (two) times daily with a meal.   cetirizine (ZYRTEC) 10 MG tablet Take 10 mg by mouth at bedtime.   cholecalciferol (VITAMIN D) 25 MCG (1000 UNIT) tablet Take 1,000 Units  by mouth daily.   Coenzyme Q10 (COQ10) 200 MG CAPS Take 200 mg by mouth at bedtime.   docusate sodium (COLACE) 50 MG capsule  Take 50 mg by mouth daily.   fluticasone (FLONASE) 50 MCG/ACT nasal spray Place 1 spray into both nostrils 2 (two) times daily as needed.   furosemide (LASIX) 20 MG tablet TAKE 1 TABLET BY MOUTH DAILY AS NEEDED FOR FLUID (AS NEEDED FOR WEIGHT GAIN OF 2 POUNDS OVERNIGHT OR 5 POUNDS IN ONE WEEK.). TAKE FOR 3 CONSECUTIVE DAYS, THEN AS NEEDED   lisinopril-hydrochlorothiazide (ZESTORETIC) 20-12.5 MG tablet Take 1 tablet by mouth daily.   Multiple Vitamin (MULTIVITAMIN WITH MINERALS) TABS tablet Take 1 tablet by mouth daily. 50 +   polyethylene glycol (MIRALAX / GLYCOLAX) 17 g packet Take 17 g by mouth daily.   rosuvastatin (CRESTOR) 20 MG tablet Take 20 mg by mouth at bedtime.   vardenafil (LEVITRA) 20 MG tablet Take 20 mg by mouth daily as needed for erectile dysfunction.   zolpidem (AMBIEN) 5 MG tablet Take 5 mg by mouth at bedtime as needed for sleep.    Allergies: Amoxicillin  Social History   Tobacco Use   Smoking status: Never   Smokeless tobacco: Never  Vaping Use   Vaping Use: Never used  Substance Use Topics   Alcohol use: Never   Drug use: Never    Family History  Problem Relation Age of Onset   Heart Problems Mother    Heart Problems Sister    Heart attack Brother    Heart Problems Brother    Dementia Brother    Heart Problems Brother     Review of Systems: A 12-system review of systems was performed and was negative except as noted in the HPI.  --------------------------------------------------------------------------------------------------  Physical Exam: BP (!) 160/84 (BP Location: Left Arm, Patient Position: Sitting, Cuff Size: Normal)   Pulse (!) 58   Ht '5\' 7"'$  (1.702 m)   Wt 187 lb 12.8 oz (85.2 kg)   SpO2 96%   BMI 29.41 kg/m  Repeat BP: 168/76  General:  NAD. Neck: No JVD or HJR. Lungs: Clear to auscultation bilaterally without wheezes or crackles. Heart: Regular rate and rhythm with occasional extrasystoles.  No murmurs Abdomen: Soft, nontender,  nondistended. Extremities: No lower extremity edema.  EKG: Sinus bradycardia with first-degree AV block and PACs, left axis deviation, right bundle branch block, septal infarct, nonspecific T wave abnormality.  Compared with prior tracing from 10/02/2021, PACs are now present.  PR interval is slightly longer as well.  Lab Results  Component Value Date   WBC 6.0 04/25/2021   HGB 13.8 04/25/2021   HCT 41.9 04/25/2021   MCV 95.0 04/25/2021   PLT 157 04/25/2021    Lab Results  Component Value Date   NA 141 04/25/2021   K 3.6 04/25/2021   CL 103 04/25/2021   CO2 30 04/25/2021   BUN 18 04/25/2021   CREATININE 0.65 04/25/2021   GLUCOSE 94 04/25/2021   ALT 17 04/25/2021    No results found for: "CHOL", "HDL", "LDLCALC", "LDLDIRECT", "TRIG", "CHOLHDL"  --------------------------------------------------------------------------------------------------  ASSESSMENT AND PLAN: Coronary artery disease: No angina reported.  Will continue his current regimen of aspirin and rosuvastatin for secondary prevention as well as low-dose carvedilol, though we will need to keep a close eye on his conduction disease, as his PR interval is slightly longer today and his QRS complex also a bit wider.  Heart failure with mildly reduced ejection fraction: Larry Allen appears euvolemic  without heart failure symptoms.  I agree with escalation of lisinopril to 40 mg daily and discontinuation of HCTZ given concerns for erectile dysfunction.  We will continue carvedilol 3.125 mg twice daily, though we will need to continue monitoring Larry Allen's heart rate and conduction disease closely he does not have any symptoms of high-grade AV block.  Hypertension: Blood pressure moderately elevated today.  Blood pressure had been better controlled earlier this month.  Larry Allen attributes his elevated blood pressure readings today to stress from multiple MD visits this week.  We will move forward with the changes to  lisinopril and HCTZ recommended by his PCP.  I encouraged Larry Allen to follow-up with his PCP for repeat BMP in about 2 weeks to ensure stable renal function and potassium.  Hyperlipidemia: Lipids adequately controlled with direct LDL of 62 on recent labs through Dr. Jeralene Huff.  Continue current dose of rosuvastatin.  Follow-up: Return to clinic in 6 months.  Nelva Bush, MD 04/03/2022 9:21 AM

## 2022-10-20 ENCOUNTER — Other Ambulatory Visit: Payer: Self-pay | Admitting: Nurse Practitioner

## 2022-11-06 ENCOUNTER — Encounter: Payer: Self-pay | Admitting: Internal Medicine

## 2022-11-06 ENCOUNTER — Ambulatory Visit: Payer: Medicare HMO | Admitting: Internal Medicine

## 2022-11-06 VITALS — BP 160/82 | HR 61 | Ht 66.0 in | Wt 185.4 lb

## 2022-11-06 DIAGNOSIS — E782 Mixed hyperlipidemia: Secondary | ICD-10-CM

## 2022-11-06 DIAGNOSIS — I5022 Chronic systolic (congestive) heart failure: Secondary | ICD-10-CM

## 2022-11-06 DIAGNOSIS — I10 Essential (primary) hypertension: Secondary | ICD-10-CM | POA: Diagnosis not present

## 2022-11-06 DIAGNOSIS — I2510 Atherosclerotic heart disease of native coronary artery without angina pectoris: Secondary | ICD-10-CM | POA: Diagnosis not present

## 2022-11-06 NOTE — Patient Instructions (Signed)
Medication Instructions:  Your physician recommends that you continue on your current medications as directed. Please refer to the Current Medication list given to you today.   *If you need a refill on your cardiac medications before your next appointment, please call your pharmacy*   Lab Work: No labs ordered today    Testing/Procedures: No test ordered today    Follow-Up: At [facility], you and your health needs are our priority.  As part of our continuing mission to provide you with exceptional heart care, we have created designated Provider Care Teams.  These Care Teams include your primary Cardiologist (physician) and Advanced Practice Providers (APPs -  Physician Assistants and Nurse Practitioners) who all work together to provide you with the care you need, when you need it.  We recommend signing up for the patient portal called "MyChart".  Sign up information is provided on this After Visit Summary.  MyChart is used to connect with patients for Virtual Visits (Telemedicine).  Patients are able to view lab/test results, encounter notes, upcoming appointments, etc.  Non-urgent messages can be sent to your provider as well.   To learn more about what you can do with MyChart, go to [email or web address].    Your next appointment:   6 month(s)  Provider:   You may see Yvonne Kendall, MD or one of the following Advanced Practice Providers on your designated Care Team:   Nicolasa Ducking, NP Eula Listen, PA-C Cadence Fransico Michael, PA-C Charlsie Quest, NP    Dr. Okey Dupre recommends checking and keeping a log of your blood pressure daily. Please call the office if you notice your blood pressure is consistently greater than 140/90.  It is best to check your blood pressure 1-2 hours after taking your medications.  Below are some tips that our clinical pharmacists share for home BP monitoring:          Rest 10 minutes before taking your blood pressure.          Don't smoke or drink  caffeinated beverages for at least 30 minutes before.          Take your blood pressure before (not after) you eat.          Sit comfortably with your back supported and both feet on the floor (don't cross your legs).          Elevate your arm to heart level on a table or a desk.          Use the proper sized cuff. It should fit smoothly and snugly around your bare upper arm. There should be enough room to slip a fingertip under the cuff. The bottom edge of the cuff should be 1 inch above the crease of the elbow.

## 2022-11-06 NOTE — Progress Notes (Unsigned)
Cardiology Office Note:  .   Date:  11/08/2022  ID:  Larry Allen, DOB 1940-07-20, MRN [id number] PCP: Loyal Jacobson, MD   HeartCare Providers Cardiologist:  Yvonne Kendall, MD     History of Present Illness: .   Larry Allen is a 82 y.o. male with history of nonobstructive coronary artery disease, nonischemic cardiomyopathy with moderately reduced LVEF (40-45%), hypertension, hyperlipidemia, and prostate cancer, who presents for follow-up of coronary artery disease and cardiomyopathy.  I last saw him in 03/2022, at which time he was feeling well he was concerned about elevated blood pressures over the preceding week.  He notes that he had recently been advised to switch from lisinopril-HCTZ to lisinopril alone due to erectile dysfunction.  We did not make any medication changes or pursue additional testing.  Today, Larry Allen reports that he is feeling fairly well.  He gets a little short of breath when he "rushes" but otherwise is asymptomatic.  This has been stable for years.  He denies chest pain, palpitations, lightheadedness, edema, and orthopnea.  He is tolerating his medications well.  Home blood pressures are typically 1 10-1 35/50-80.  ROS: See HPI  Studies Reviewed: Marland Kitchen   EKG Interpretation Date/Time:  Friday November 06 2022 09:45:25 EDT Ventricular Rate:  61 PR Interval:  222 QRS Duration:  134 QT Interval:  402 QTC Calculation: 404 R Axis:   -74  Text Interpretation: Sinus rhythm with 1st degree A-V block Left axis deviation Right bundle branch block Possible Septal infarct (cited on or before 06-Nov-2022) T wave abnormality, consider lateral ischemia When compared with ECG of 03-Apr-2022 Premature atrial complexes are no longer Present Confirmed by Wyeth Hoffer, Cristal Deer [phone]) on 11/08/2022 1:47:55 PM    Cath/PCI: LHC (03/12/2020): LMCA, LAD, ramus intermedius, and LCx normal.  Tortuous RCA with 50% proximal stenosis.  LVEDP 15-20 mmHg.   Non-Invasive  Evaluation(s): TTE (07/30/2020): Normal LV size and wall thickness.  LVEF 45-50% with global hypokinesis and grade 1 diastolic dysfunction.  Elevated filling pressure noted.  Mild pulmonary hypertension with mild dilation of the right ventricle.  Mild left atrial enlargement.  Moderate mitral regurgitation and mild tricuspid regurgitation present. Pharmacologic MPI (03/04/2020): Intermediate risk study with small fixed apical inferior defect suggestive of scar.  LVEF 45-54%.  Coronary artery calcification present. TTE (02/29/2020): Normal LV size and wall thickness.  LVEF 40-45% with global hypokinesis and grade 1 diastolic dysfunction.  Normal RV size and function.  Mild pulmonary hypertension.  Mild to moderate mitral regurgitation.  Mild tricuspid regurgitation.  Mild aortic sclerosis without stenosis.  Risk Assessment/Calculations:      Physical Exam:   VS:  BP (!) 160/82   Pulse 61   Ht 5\' 6"  (1.676 m)   Wt 185 lb 6.4 oz (84.1 kg)   SpO2 98%   BMI 29.92 kg/m    Wt Readings from Last 3 Encounters:  11/06/22 185 lb 6.4 oz (84.1 kg)  04/03/22 187 lb 12.8 oz (85.2 kg)  10/02/21 187 lb 6 oz (85 kg)    General:  NAD. Neck: No JVD or HJR. Lungs: Clear to auscultation bilaterally without wheezes or crackles. Heart: Regular rate and rhythm with 2/6 systolic murmur. Abdomen: Soft, nontender, nondistended. Extremities: No lower extremity edema.  ASSESSMENT AND PLAN: .    Coronary artery disease: Larry Allen continues to do well without angina.  Continue aspirin, carvedilol, and rosuvastatin for secondary prevention.  Mild PR prolongation is relatively stable, though we will need to continue monitoring this closely  to ensure he that he does not develop more significant AV block.  Heart failure with mildly reduced ejection fraction: Larry Allen appears euvolemic with stable NYHA class II heart failure symptoms.  Continue low-dose carvedilol as well as lisinopril 40 mg  daily.  Hypertension: Blood pressure suboptimally controlled today that typically at goal at home.  We discussed escalation of his antihypertensive regimen but have agreed to defer this in favor of sodium restriction and continued monitoring at home.  He should alert [place] if his blood pressure is consistently above 140/90.  Hyperlipidemia: Continue rosuvastatin 20 mg daily.     Dispo: Return to clinic in 6 months.  Signed, Yvonne Kendall, MD

## 2022-11-08 ENCOUNTER — Encounter: Payer: Self-pay | Admitting: Internal Medicine

## 2022-11-08 DIAGNOSIS — E782 Mixed hyperlipidemia: Secondary | ICD-10-CM | POA: Insufficient documentation

## 2022-11-08 DIAGNOSIS — I5022 Chronic systolic (congestive) heart failure: Secondary | ICD-10-CM | POA: Insufficient documentation

## 2023-01-15 ENCOUNTER — Other Ambulatory Visit: Payer: Self-pay | Admitting: Nurse Practitioner

## 2023-03-05 ENCOUNTER — Encounter: Payer: Self-pay | Admitting: Internal Medicine

## 2023-03-05 NOTE — Telephone Encounter (Signed)
I recommend addition of hydrochlorothiazide 12.5 mg daily with BMP in 1 month.  Continue other medications as prescribed.  Yvonne Kendall, MD [facility]

## 2023-03-08 ENCOUNTER — Other Ambulatory Visit: Payer: Self-pay

## 2023-03-08 DIAGNOSIS — I5022 Chronic systolic (congestive) heart failure: Secondary | ICD-10-CM

## 2023-03-08 DIAGNOSIS — I2510 Atherosclerotic heart disease of native coronary artery without angina pectoris: Secondary | ICD-10-CM

## 2023-03-08 DIAGNOSIS — I10 Essential (primary) hypertension: Secondary | ICD-10-CM

## 2023-03-08 MED ORDER — HYDROCHLOROTHIAZIDE 12.5 MG PO CAPS: 12.5000 mg | ORAL_CAPSULE | Freq: Every day | ORAL | 3 refills | Status: DC

## 2023-07-06 NOTE — Progress Notes (Unsigned)
 Cardiology Office Note    Date:  07/07/2023   ID:  Allen, Larry 02-13-1941, MRN [id number]  PCP:  Jayne Mews, MD  Cardiologist:  Sammy Crisp, MD  Electrophysiologist:  None   Chief Complaint: Follow up  History of Present Illness:   Larry Allen is a 83 y.o. male with history of nonobstructive CAD, HFmrEF secondary to NICM, HTN, HLD, PVCs, thrombocytopenia, and prostate cancer presents for CAD, cardiomyopathy, HTN, and HLD.  He was evaluated in 02/2020 in the setting of abnormal EKG prior to left total shoulder arthroplasty.  At that time he reported exertional dyspnea.  Echo showed an EF of 40 to 45% with global hypokinesis, grade 1 diastolic dysfunction, and mild to moderate mitral regurgitation.  Lexiscan  MPI showed a small defect of moderate severity in the apical inferior location suggestive of prior infarct.  Mild coronary artery calcifications were also noted.  He underwent diagnostic cardiac cath which showed 50% proximal RCA stenosis and otherwise normal coronary arteries.  Follow-up echo in 07/2020 showed a slight improvement in LV systolic function with an EF of 45 to 50% with global hypokinesis, grade 1 diastolic dysfunction, and moderate mitral regurgitation.  He was last seen in the office in 10/2022 and was doing fairly well with that a little shortness of breath when he "rushes" that had been stable for years.  In 02/2023 HCTZ was recommended to be added back with elevated BP readings ranging from 140-163 systolic.  He comes in doing well from a cardiac perspective and is without symptoms of angina or cardiac decompensation.  Since adding back HCTZ blood pressures have been very well-controlled at home with readings in the 1 teens to 120s systolic.  No dizziness, presyncope, or syncope.  No falls or symptoms concerning for bleeding.  His weight is down 10 pounds today when compared to his visit in 10/2022, he indicates this is intentional with lifestyle modification.   No progressive orthopnea.  Is taking as needed furosemide  typically no more than once to twice per week.  Overall, he feels well from a cardiac perspective and does not have any acute concerns at this time.   Labs independently reviewed: 03/2023 - potassium 4.1, BUN 25, serum creatinine 0.8, albumin 4.1, AST/ALT normal, TC 117, TG 46, HDL 44, LDL 60, TSH normal, Hgb 13.9, PLT 120  Past Medical History:  Diagnosis Date   Arthritis    Chronic combined systolic (congestive) and diastolic (congestive) heart failure (HCC)    a.) TTE: 02/29/2020: EF 40-45%; global HK; PASP 37.5 mmHg; mild-mod MR; G1DD.  b.) TTE 07/30/2020: EF 45-50%; global HK; G1DD;  nl RV fxn; mildly dil LA; mod MR.   DOE (dyspnea on exertion)    Erectile dysfunction    a.) on PDE5i medication (vardenafil)   HLD (hyperlipidemia)    Hypertension    Insomnia    a.) on zolpidem   Mitral valve regurgitation    a.) TTE 02/29/2020: EF 40-45%; mild-mod MR. b.) TTE 07/30/2020: EF 45-50%; mod MR   NICM (nonischemic cardiomyopathy) (HCC)    a. 02/2020 Echo: EF 40-45%; b. 07/2020 Echo: EF 45-50%.   Nonobstructive CAD (coronary artery disease)    a. 02/2020 MV: inf infarct, no ischemia; b. 02/2020 Cath: LM nl, LAD nl, D1/2/3 nl, RI nl, LCX nl, OM1/2/ nl, RCA 50p, RPDA/RPAV, RPL1/2/3 nl. LVEDP 15-102mmHg-->Med rx.   Pneumonia    Prostate cancer (HCC) 2016   Right bundle branch block (RBBB) with left anterior fascicular block (LAFB) 10/16/2020  Thrombocytopenia [facility])     Past Surgical History:  Procedure Laterality Date   APPENDECTOMY     BICEPT TENODESIS Right 05/08/2021   Procedure: BICEPS TENODESIS;  Surgeon: Elner Hahn, MD;  Location: [facility] ORS;  Service: Orthopedics;  Laterality: Right;   COLONOSCOPY W/ POLYPECTOMY     HERNIA REPAIR  2020   JOINT REPLACEMENT Left 2020   knee   LEFT HEART CATH AND CORONARY ANGIOGRAPHY N/A 03/12/2020   Procedure: LEFT HEART CATH AND CORONARY ANGIOGRAPHY;  Surgeon: Sammy Crisp, MD;   Location: [facility] INVASIVE CV LAB;  Service: Cardiovascular;  Laterality: N/A;   REVERSE SHOULDER ARTHROPLASTY Right 05/08/2021   Procedure: REVERSE SHOULDER ARTHROPLASTY;  Surgeon: Elner Hahn, MD;  Location: [facility] ORS;  Service: Orthopedics;  Laterality: Right;   TOTAL SHOULDER ARTHROPLASTY Left 04/30/2020   Procedure: TOTAL SHOULDER ARTHROPLASTY;  Surgeon: Elner Hahn, MD;  Location: [facility] ORS;  Service: Orthopedics;  Laterality: Left;    Current Medications: Current Meds  Medication Sig   acetaminophen  (TYLENOL ) 500 MG tablet Take 500 mg by mouth every 6 (six) hours as needed for moderate pain or mild pain (for pain.).   aspirin  EC 81 MG tablet Take 81 mg by mouth daily. Swallow whole.   calcium carbonate (OSCAL) 1500 (600 Ca) MG TABS tablet Take 600 mg by mouth daily.   cetirizine (ZYRTEC) 10 MG tablet Take 10 mg by mouth at bedtime.   cholecalciferol (VITAMIN D) 25 MCG (1000 UNIT) tablet Take 1,000 Units by mouth daily.   Coenzyme Q10 (COQ10) 200 MG CAPS Take 200 mg by mouth at bedtime.   docusate sodium (COLACE) 50 MG capsule Take 50 mg by mouth daily.   fluticasone (FLONASE) 50 MCG/ACT nasal spray Place 1 spray into both nostrils 2 (two) times daily as needed.   furosemide  (LASIX ) 20 MG tablet TAKE 1 TABLET BY MOUTH DAILY AS NEEDED FOR FLUID (AS NEEDED FOR WEIGHT GAIN OF 2 POUNDS OVERNIGHT OR 5 POUNDS IN ONE WEEK.). TAKE FOR 3 CONSECUTIVE DAYS, THEN AS NEEDED   hydrochlorothiazide  (MICROZIDE ) 12.5 MG capsule Take 1 capsule (12.5 mg total) by mouth daily.   lisinopril (ZESTRIL) 40 MG tablet Take 40 mg by mouth daily.   loratadine (CLARITIN) 10 MG tablet Take 10 mg by mouth.   Multiple Vitamin (MULTIVITAMIN WITH MINERALS) TABS tablet Take 1 tablet by mouth daily. 50 +   polyethylene glycol (MIRALAX / GLYCOLAX) 17 g packet Take 17 g by mouth daily.   rosuvastatin (CRESTOR) 20 MG tablet Take 20 mg by mouth at bedtime.   senna-docusate (SENOKOT-S) 8.6-50 MG tablet Take 1 tablet by mouth.    tadalafil (CIALIS) 20 MG tablet Take 1/2-1 tablet by mouth every other day as needed for ED.   vardenafil (LEVITRA) 20 MG tablet Take 20 mg by mouth daily as needed for erectile dysfunction.   zolpidem (AMBIEN) 5 MG tablet Take 5 mg by mouth at bedtime as needed for sleep.   [DISCONTINUED] carvedilol  (COREG ) 3.125 MG tablet TAKE 1 TABLET BY MOUTH TWICE A DAY WITH FOOD    Allergies:   Amoxicillin   Social History   Socioeconomic History   Marital status: Married    Spouse name: Not on file   Number of children: Not on file   Years of education: Not on file   Highest education level: Not on file  Occupational History   Not on file  Tobacco Use   Smoking status: Never   Smokeless tobacco: Never  Vaping Use  Vaping status: Never Used  Substance and Sexual Activity   Alcohol use: Never   Drug use: Never   Sexual activity: Not on file  Other Topics Concern   Not on file  Social History Narrative   Not on file   Social Drivers of Health   Financial Resource Strain: Low Risk  (09/25/2021)   [facility] to [facility]16[facility] visits prior to 05/16/2022.   Overall Financial Resource Strain (CARDIA)    Difficulty of Paying Living Expenses: Not hard at all  Food Insecurity: Low Risk  (04/02/2023)   [facility]   Hunger Vital Sign    Worried About Running Out of Food in the Last Year: Never true    Ran Out of Food in the Last Year: Never true  Transportation Needs: No Transportation Needs (04/02/2023)   Received from [employer]    In the past 12 months, has lack of reliable transportation kept you from medical appointments, meetings, work or from getting things needed for daily living? : No  Physical Activity: Unknown (09/25/2021)   [facility] prior to 05/16/2022., [facility] visits  prior to 05/16/2022.   Exercise Vital Sign    Days of Exercise per Week: Not on file    Minutes of Exercise per Session: 20 min  Stress: No Stress Concern Present (09/25/2021)   [facility] visits prior to 05/16/2022., [facility] visits prior to 05/16/2022.   [employer] of Occupational Health - Occupational Stress Questionnaire    Feeling of Stress : Not at all  Social Connections: Socially Integrated (09/25/2021)   [facility] visits prior to 05/16/2022., [facility] visits prior to 05/16/2022.   Social Connection and Isolation Panel [NHANES]    Frequency of Communication with Friends and Family: Three times a week    Frequency of Social Gatherings with Friends and Family: Once a week    Attends Religious Services: More than 4 times per year    Active Member of [employer] or Organizations: Yes    Attends Engineer, structural: More than 4 times per year    Marital Status: Married     Family History:  The patient's family history includes Dementia in his brother; Heart Problems in his brother, brother, mother, and sister; Heart attack in his brother.  ROS:   12-point review of systems is negative unless otherwise noted in the HPI.   EKGs/Labs/Other Studies Reviewed:    Studies reviewed were summarized above. The additional studies were reviewed today:  2D echo 07/30/2020: 1. Left ventricular ejection fraction, by estimation, is 45 to 50%. The  left ventricle has mildly decreased function. The left ventricle  demonstrates global hypokinesis. Left ventricular diastolic parameters are  consistent with Grade I diastolic  dysfunction (impaired relaxation). Elevated left atrial pressure.   2. Right ventricular systolic function is normal. The right ventricular  size is mildly enlarged.   3. Left atrial size was mildly dilated.   4. The mitral  valve is normal in structure. Moderate mitral valve  regurgitation. No evidence of mitral stenosis.   5. The aortic valve is tricuspid. Aortic valve regurgitation is not  visualized. No aortic stenosis is present.   6. The pulmonic valve was abnormal.  __________  LHC 03/12/2020: Conclusions: Moderate, non-obstructive coronary artery disease with 50% proximal RCA stenosis.  No significant disease noted in the LAD and LCx. Mildly elevated left ventricular filling pressure.   Recommendations: Medical therapy and risk factor modification to prevent progression of coronary artery disease. Optimize goal-directed medical therapy for management of non-ischemic cardiomyopathy. Follow-up as planned with me on 03/20/2020.  If no post-cath complications noted at that time, I think it would be reasonable for Mr. Mcnutt to proceed with elective shoulder surgery thereafter. __________  Lexiscan  MPI 03/04/2020: There was no ST segment deviation noted during stress. EKG is not interpretable due to IVCD. Frequent PVCs noted. Defect 1: There is a small defect of moderate severity present in the apical inferior location. This is suggestive of prior small infarct. The left ventricular ejection fraction is mildly decreased (45-54%). This is an intermediate risk study. CT attenuation images shows mild coronary calcifications especially in the RCA distribution. __________  2D echo 02/29/2020: 1. Left ventricular ejection fraction, by estimation, is 40 to 45%. The  left ventricle has mildly decreased function. The left ventricle  demonstrates global hypokinesis.Frequent ectopy and bundle branch block  noted, unable to exclude regional wall  motion abnormality. Left ventricular diastolic parameters are consistent  with Grade I diastolic dysfunction (impaired relaxation).   2. Right ventricular systolic function is normal. The right ventricular  size is normal. There is mildly elevated pulmonary artery  systolic  pressure. The estimated right ventricular systolic pressure is 37.5 mmHg.   3. The mitral valve is normal in structure. Mild to moderate mitral valve  regurgitation.   4. Challenging images.     EKG:  EKG is not ordered today.  Anticipate tracing at next visit.  Recent Labs: No results found for requested labs within last 365 days.  Recent Lipid Panel No results found for: "CHOL", "TRIG", "HDL", "CHOLHDL", "VLDL", "LDLCALC", "LDLDIRECT"  PHYSICAL EXAM:    VS:  BP (!) 106/56   Pulse 76   Ht 5' 6.5" (1.689 m)   Wt 175 lb 6.4 oz (79.6 kg)   SpO2 97%   BMI 27.89 kg/m   BMI: Body mass index is 27.89 kg/m.  Physical Exam Vitals reviewed.  Constitutional:      Appearance: He is well-developed.  HENT:     Head: Normocephalic and atraumatic.  Eyes:     General:        Right eye: No discharge.        Left eye: No discharge.  Cardiovascular:     Rate and Rhythm: Normal rate and regular rhythm.     Heart sounds: S1 normal and S2 normal. Heart sounds not distant. No midsystolic click and no opening snap. Murmur heard.     Systolic murmur is present with a grade of 2/6 at the upper left sternal border.     No friction rub.  Pulmonary:     Effort: Pulmonary effort is normal. No respiratory distress.     Breath sounds: Normal breath sounds. No decreased breath sounds, wheezing, rhonchi or rales.  Chest:     Chest wall: No tenderness.  Musculoskeletal:     Cervical back: Normal range of motion.     Right lower leg: No edema.     Left lower leg: No edema.  Skin:    General: Skin is warm and dry.     Nails: There is no clubbing.  Neurological:     Mental Status: He is alert and oriented to person, place, and  time.  Psychiatric:        Speech: Speech normal.        Behavior: Behavior normal.        Thought Content: Thought content normal.        Judgment: Judgment normal.     Wt Readings from Last 3 Encounters:  07/07/23 175 lb 6.4 oz (79.6 kg)  11/06/22 185 lb  6.4 oz (84.1 kg)  04/03/22 187 lb 12.8 oz (85.2 kg)     ASSESSMENT & PLAN:   CAD involving the native coronaries without angina: He continues to do well and is without symptoms of angina or cardiac decompensation.  Continue aggressive risk factor modification and secondary prevention including aspirin  81 mg, carvedilol  3.125 mg twice daily, and rosuvastatin 20 mg.  No indication for further ischemic testing at this time.  HFmrEF: He is euvolemic and well compensated with NYHA class II symptoms.  He remains on carvedilol  3.125 mg twice daily and lisinopril 40 mg daily.  He is using furosemide  as needed and typically no more than once to twice per week.  Mitral regurgitation: Moderate by echo in 2022.  Anticipate follow-up echo at next visit.  HTN: Blood pressure is well-controlled at home and in the office today.  Continue carvedilol  3.125 mg twice daily, HCTZ 12.5 mg daily, and lisinopril 40 mg daily.  Recent labs show stable renal function and electrolytes.  HLD: LDL 60 in 03/2023 with normal AST/ALT at that time.  He remains on rosuvastatin 20 mg.      Disposition: F/u with Dr. Nolan Battle or an APP in 6 months.   Medication Adjustments/Labs and Tests Ordered: Current medicines are reviewed at length with the patient today.  Concerns regarding medicines are outlined above. Medication changes, Labs and Tests ordered today are summarized above and listed in the Patient Instructions accessible in Encounters.   Signed, Varney Gentleman, PA-C 07/07/2023 9:51 AM     [facility] [address] [place], [place] [address] [phone]

## 2023-07-07 ENCOUNTER — Encounter: Payer: Self-pay | Admitting: Physician Assistant

## 2023-07-07 ENCOUNTER — Ambulatory Visit: Payer: Medicare HMO | Admitting: Internal Medicine

## 2023-07-07 ENCOUNTER — Ambulatory Visit: Attending: Physician Assistant | Admitting: Physician Assistant

## 2023-07-07 VITALS — BP 106/56 | HR 76 | Ht 66.5 in | Wt 175.4 lb

## 2023-07-07 DIAGNOSIS — E785 Hyperlipidemia, unspecified: Secondary | ICD-10-CM

## 2023-07-07 DIAGNOSIS — I10 Essential (primary) hypertension: Secondary | ICD-10-CM | POA: Diagnosis not present

## 2023-07-07 DIAGNOSIS — I2510 Atherosclerotic heart disease of native coronary artery without angina pectoris: Secondary | ICD-10-CM | POA: Diagnosis not present

## 2023-07-07 DIAGNOSIS — I5022 Chronic systolic (congestive) heart failure: Secondary | ICD-10-CM | POA: Diagnosis not present

## 2023-07-07 MED ORDER — CARVEDILOL 3.125 MG PO TABS: 3.1250 mg | ORAL_TABLET | Freq: Two times a day (BID) | ORAL | 3 refills | Status: AC

## 2023-07-07 NOTE — Patient Instructions (Signed)
 Medication Instructions:  Your Physician recommend you continue on your current medication as directed.    *If you need a refill on your cardiac medications before your next appointment, please call your pharmacy*  Lab Work: None ordered at this time   Follow-Up: At [facility], you and your health needs are our priority.  As part of our continuing mission to provide you with exceptional heart care, our providers are all part of one team.  This team includes your primary Cardiologist (physician) and Advanced Practice Providers or APPs (Physician Assistants and Nurse Practitioners) who all work together to provide you with the care you need, when you need it.  Your next appointment:   6 month(s)  Provider:   You may see Sammy Crisp, MD or Varney Gentleman, PA-C

## 2024-01-19 NOTE — Progress Notes (Unsigned)
 Cardiology Office Note    Date:  01/20/2024   ID:  Larry Allen, DOB Sep 15, 1940, MRN [id number]  PCP:  Millicent Sharper, MD  Cardiologist:  Lonni Hanson, MD  Electrophysiologist:  None   Chief Complaint: Follow up  History of Present Illness:   Larry Allen is a 83 y.o. male with history of nonobstructive CAD, HFmrEF secondary to NICM, HTN, HLD, PVCs, thrombocytopenia, and prostate cancer presents for follow up of CAD, cardiomyopathy, HTN, and HLD.   He was evaluated in 02/2020 in the setting of abnormal EKG prior to left total shoulder arthroplasty.  At that time he reported exertional dyspnea.  Echo showed an EF of 40 to 45% with global hypokinesis, grade 1 diastolic dysfunction, and mild to moderate mitral regurgitation.  Lexiscan  MPI showed a small defect of moderate severity in the apical inferior location suggestive of prior infarct.  Mild coronary artery calcifications were also noted.  He underwent diagnostic cardiac cath which showed 50% proximal RCA stenosis and otherwise normal coronary arteries.  Follow-up echo in 07/2020 showed a slight improvement in LV systolic function with an EF of 45 to 50% with global hypokinesis, grade 1 diastolic dysfunction, and moderate mitral regurgitation.    He was last seen in the office in 06/2023 and was doing well from a cardiac perspective.  Following reinitiation of HCTZ blood pressures were well-controlled.  Weight was down 10 pounds with lifestyle modification.  He was taking as needed furosemide  once to twice per week.  No changes were pursued in cardiac medications at that time.  He comes in doing well from a cardiac perspective and is without symptoms of angina or cardiac decompensation.  Remains active at baseline working in the yard in his shop.  Plays golf weekly.  No palpitations, dizziness, presyncope, or syncope.  No lower extremity swelling or orthopnea.  No falls or symptoms concerning for bleeding.  Blood pressure overall has  been well-controlled at home largely in the 1-teens to 120s mmHg systolic.  Does not have any acute cardiac concerns at this time.   Labs independently reviewed: 09/2023 - TC 117, TG 57, HDL 42, LDL 62 03/2023 - potassium 4.1, BUN 25, serum creatinine 0.8, albumin 4.1, AST/ALT normal, TSH normal, Hgb 13.9, PLT 120   Past Medical History:  Diagnosis Date   Arthritis    Chronic combined systolic (congestive) and diastolic (congestive) heart failure (HCC)    a.) TTE: 02/29/2020: EF 40-45%; global HK; PASP 37.5 mmHg; mild-mod MR; G1DD.  b.) TTE 07/30/2020: EF 45-50%; global HK; G1DD;  nl RV fxn; mildly dil LA; mod MR.   DOE (dyspnea on exertion)    Erectile dysfunction    a.) on PDE5i medication (vardenafil)   HLD (hyperlipidemia)    Hypertension    Insomnia    a.) on zolpidem   Mitral valve regurgitation    a.) TTE 02/29/2020: EF 40-45%; mild-mod MR. b.) TTE 07/30/2020: EF 45-50%; mod MR   NICM (nonischemic cardiomyopathy) (HCC)    a. 02/2020 Echo: EF 40-45%; b. 07/2020 Echo: EF 45-50%.   Nonobstructive CAD (coronary artery disease)    a. 02/2020 MV: inf infarct, no ischemia; b. 02/2020 Cath: LM nl, LAD nl, D1/2/3 nl, RI nl, LCX nl, OM1/2/ nl, RCA 50p, RPDA/RPAV, RPL1/2/3 nl. LVEDP 15-56mmHg-->Med rx.   Pneumonia    Prostate cancer (HCC) 2016   Right bundle branch block (RBBB) with left anterior fascicular block (LAFB) 10/16/2020   Thrombocytopenia     Past Surgical History:  Procedure Laterality  Date   APPENDECTOMY     BICEPT TENODESIS Right 05/08/2021   Procedure: BICEPS TENODESIS;  Surgeon: Edie Norleen PARAS, MD;  Location: [facility] ORS;  Service: Orthopedics;  Laterality: Right;   COLONOSCOPY W/ POLYPECTOMY     HERNIA REPAIR  2020   JOINT REPLACEMENT Left 2020   knee   LEFT HEART CATH AND CORONARY ANGIOGRAPHY N/A 03/12/2020   Procedure: LEFT HEART CATH AND CORONARY ANGIOGRAPHY;  Surgeon: Mady Bruckner, MD;  Location: [facility];  Service: Cardiovascular;  Laterality: N/A;    REVERSE SHOULDER ARTHROPLASTY Right 05/08/2021   Procedure: REVERSE SHOULDER ARTHROPLASTY;  Surgeon: Edie Norleen PARAS, MD;  Location: [facility] OR[facility];  Service: Orthopedics;  Laterality: Right;   TOTAL SHOULDER ARTHROPLASTY Left 04/30/2020   Procedure: TOTAL SHOULDER ARTHROPLASTY;  Surgeon: Edie Norleen PARAS, MD;  Location: [facility] OR[facility];  Service: Orthopedics;  Laterality: Left;    Current Medications: Current Meds  Medication Sig   acetaminophen  (TYLENOL ) 500 MG tablet Take 500 mg by mouth every 6 (six) hours as needed for moderate pain or mild pain (for pain.).   aspirin  EC 81 MG tablet Take 81 mg by mouth daily. Swallow whole.   calcium carbonate (OSCAL) 1500 (600 Ca) MG TABS tablet Take 600 mg by mouth daily.   carvedilol  (COREG ) 3.125 MG tablet Take 1 tablet (3.125 mg total) by mouth 2 (two) times daily with a meal.   cetirizine (ZYRTEC) 10 MG tablet Take 10 mg by mouth at bedtime.   cholecalciferol (VITAMIN D) 25 MCG (1000 UNIT) tablet Take 1,000 Units by mouth daily.   Coenzyme Q10 (COQ10) 200 MG CAPS Take 200 mg by mouth at bedtime.   docusate sodium (COLACE) 50 MG capsule Take 50 mg by mouth daily.   fluticasone (FLONASE) 50 MCG/ACT nasal spray Place 1 spray into both nostrils 2 (two) times daily as needed.   furosemide  (LASIX ) 20 MG tablet TAKE 1 TABLET BY MOUTH DAILY AS NEEDED FOR FLUID (AS NEEDED FOR WEIGHT GAIN OF 2 POUNDS OVERNIGHT OR 5 POUNDS IN ONE WEEK.). TAKE FOR 3 CONSECUTIVE DAYS, THEN AS NEEDED   hydrochlorothiazide  (MICROZIDE ) 12.5 MG capsule Take 1 capsule (12.5 mg total) by mouth daily.   lisinopril (ZESTRIL) 40 MG tablet Take 40 mg by mouth daily.   loratadine (CLARITIN) 10 MG tablet Take 10 mg by mouth.   Multiple Vitamin (MULTIVITAMIN WITH MINERALS) TABS tablet Take 1 tablet by mouth daily. 50 +   polyethylene glycol (MIRALAX / GLYCOLAX) 17 g packet Take 17 g by mouth daily.   rosuvastatin (CRESTOR) 20 MG tablet Take 20 mg by mouth at bedtime.   senna-docusate (SENOKOT-S) 8.6-50  MG tablet Take 1 tablet by mouth.   tadalafil (CIALIS) 20 MG tablet Take 1/2-1 tablet by mouth every other day as needed for ED.   vardenafil (LEVITRA) 20 MG tablet Take 20 mg by mouth daily as needed for erectile dysfunction.   zolpidem (AMBIEN) 5 MG tablet Take 5 mg by mouth at bedtime as needed for sleep.    Allergies:   Amoxicillin   Social History   Socioeconomic History   Marital status: Married    Spouse name: Not on file   Number of children: Not on file   Years of education: Not on file   Highest education level: Not on file  Occupational History   Not on file  Tobacco Use   Smoking status: Never   Smokeless tobacco: Never  Vaping Use   Vaping status: Never Used  Substance and  Sexual Activity   Alcohol use: Never   Drug use: Never   Sexual activity: Not on file  Other Topics Concern   Not on file  Social History Narrative   Not on file   Social Drivers of Health   Financial Resource Strain: Low Risk  (09/25/2021)   Received from [facility] visits prior to 05/16/2022[facility]   Overall Financial Resource Strain (CARDIA)    Difficulty of Paying Living Expenses: Not hard at all  Food Insecurity: Low Risk  (04/02/2023)   Received from [facility]   Hunger Vital Sign    Within the past 12 months, you worried that your food would run out before you got money to buy more: Never true    Within the past 12 months, the food you bought just didn't last and you didn't have money to get more. : Never true  Transportation Needs: No Transportation Needs (04/02/2023)   Received from [facility]    In the past 12 months, has lack of reliable transportation kept you from medical appointments, meetings, work or from getting things needed for daily living? : No  Physical Activity: Unknown (09/25/2021)   Received from [facility] visits prior to 05/16/2022., [facility]   Exercise Vital Sign    Days of Exercise  per Week: Not on file    On average, how many minutes do you engage in exercise at this level?: 20 min  Stress: No Stress Concern Present (09/25/2021)   [facility] prior to [facility]/16/2022., [facility]   [employer] of Occupational Health - Occupational Stress Questionnaire    Feeling of Stress : Not at all  Social Connections: Socially Integrated (09/25/2021)   [facility] prior to 05/16/2022., [facility]   Social Connection and Isolation Panel    In a typical week, how many times do you talk on the phone with family, friends, or neighbors?: Three times a week    How often do you get together with friends or relatives?: Once a week    How often do you attend church or religious services?: More than 4 times per year    Do you belong to any clubs or organizations such as church groups, unions, fraternal or athletic groups, or school groups?: Yes    How often do you attend meetings of the clubs or organizations you belong to?: More than 4 times per year    Are you married, widowed, divorced, separated, never married, or living with a partner?: Married     Family History:  The patient's family history includes Dementia in his brother; Heart Problems in his brother, brother, mother, and sister; Heart attack in his brother.  ROS:   12-point review of systems is negative unless otherwise noted in the HPI.   EKGs/Labs/Other Studies Reviewed:    Studies reviewed were summarized above. The additional studies were reviewed today:  2D echo 07/30/2020: 1. Left ventricular ejection fraction, by estimation, is 45 to 50%. The  left ventricle has mildly decreased function. The left ventricle  demonstrates global hypokinesis. Left ventricular diastolic parameters are  consistent with Grade I diastolic  dysfunction (impaired relaxation). Elevated left atrial pressure.   2. Right ventricular systolic function is  normal. The right ventricular  size is mildly enlarged.   3. Left atrial size was mildly dilated.   4. The mitral valve is normal in  structure. Moderate mitral valve  regurgitation. No evidence of mitral stenosis.   5. The aortic valve is tricuspid. Aortic valve regurgitation is not  visualized. No aortic stenosis is present.   6. The pulmonic valve was abnormal.  __________   [facility] 03/12/2020: Conclusions: Moderate, non-obstructive coronary artery disease with 50% proximal RCA stenosis.  No significant disease noted in the LAD and LCx. Mildly elevated left ventricular filling pressure.   Recommendations: Medical therapy and risk factor modification to prevent progression of coronary artery disease. Optimize goal-directed medical therapy for management of non-ischemic cardiomyopathy. Follow-up as planned with me on 03/20/2020.  If no post-cath complications noted at that time, I think it would be reasonable for Mr. Roat to proceed with elective shoulder surgery thereafter. __________   Lexiscan  MPI 03/04/2020: There was no ST segment deviation noted during stress. EKG is not interpretable due to IVCD. Frequent PVCs noted. Defect 1: There is a small defect of moderate severity present in the apical inferior location. This is suggestive of prior small infarct. The left ventricular ejection fraction is mildly decreased (45-54%). This is an intermediate risk study. CT attenuation images shows mild coronary calcifications especially in the RCA distribution. __________   2D echo 02/29/2020: 1. Left ventricular ejection fraction, by estimation, is 40 to 45%. The  left ventricle has mildly decreased function. The left ventricle  demonstrates global hypokinesis.Frequent ectopy and bundle branch block  noted, unable to exclude regional wall  motion abnormality. Left ventricular diastolic parameters are consistent  with Grade I diastolic dysfunction (impaired relaxation).   2. Right  ventricular systolic function is normal. The right ventricular  size is normal. There is mildly elevated pulmonary artery systolic  pressure. The estimated right ventricular systolic pressure is 37.5 mmHg.   3. The mitral valve is normal in structure. Mild to moderate mitral valve  regurgitation.   4. Challenging images.    EKG:  EKG is ordered today.  The EKG ordered today demonstrates sinus bradycardia, 53 bpm, left axis deviation, first-degree AV block, RBBB, improved lateral ST-T changes  Recent Labs: No results found for requested labs within last 365 days.  Recent Lipid Panel No results found for: CHOL, TRIG, HDL, CHOLHDL, VLDL, LDLCALC, LDLDIRECT  PHYSICAL EXAM:    VS:  BP (!) 140/68 (BP Location: Left Arm, Patient Position: Sitting, Cuff Size: Normal) Comment: has taken his meds this am  Pulse (!) 53   Ht 5' 8 (1.727 m)   Wt 179 lb 12.8 oz (81.6 kg)   SpO2 98%   BMI 27.34 kg/m   BMI: Body mass index is 27.34 kg/m.  Physical Exam Vitals reviewed.  Constitutional:      Appearance: He is well-developed.  HENT:     Head: Normocephalic and atraumatic.  Eyes:     General:        Right eye: No discharge.        Left eye: No discharge.  Cardiovascular:     Rate and Rhythm: Normal rate and regular rhythm.     Pulses:          Posterior tibial pulses are 2+ on the right side and 2+ on the left side.     Heart sounds: S1 normal and S2 normal. Heart sounds not distant. No midsystolic click and no opening snap. Murmur heard.     Systolic murmur is present with a grade of 2/6 at the upper left sternal border.     No friction rub.  Pulmonary:  Effort: Pulmonary effort is normal. No respiratory distress.     Breath sounds: Normal breath sounds. No decreased breath sounds, wheezing, rhonchi or rales.  Musculoskeletal:     Cervical back: Normal range of motion.     Right lower leg: No edema.     Left lower leg: No edema.  Skin:    General: Skin is warm and  dry.     Nails: There is no clubbing.  Neurological:     Mental Status: He is alert and oriented to person, place, and time.  Psychiatric:        Speech: Speech normal.        Behavior: Behavior normal.        Thought Content: Thought content normal.        Judgment: Judgment normal.     Wt Readings from Last 3 Encounters:  01/20/24 179 lb 12.8 oz (81.6 kg)  07/07/23 175 lb 6.4 oz (79.6 kg)  11/06/22 185 lb 6.4 oz (84.1 kg)     ASSESSMENT & PLAN:   CAD involving the native coronaries without angina: He continues to do very well and is without symptoms concerning for angina or cardiac decompensation.  Remains active at baseline without cardiac limitation.  Continue aggressive risk factor modification and secondary prevention including aspirin  81 mg, carvedilol  3.125 mg twice daily, and rosuvastatin 20 mg.  No indication for further ischemic testing at this time.  HFmrEF: He is euvolemic and well compensated with NYHA class II symptoms. He remains on carvedilol  3.125 mg twice daily and lisinopril 40 mg daily. He is using furosemide  as needed and typically no more than once to twice per week.  Update echo.  Mitral regurgitation: Moderate by echo in 2022.  Update echo.  HTN: Blood pressure is mildly elevated in the office today though has been well-controlled at home.  He is under increased stress currently.  Remains on carvedilol  3.125 mg twice daily, HCTZ 12.5 mg, and lisinopril 40 mg.  Recent labs stable.  HLD: LDL 62 in 09/2023.  He remains on rosuvastatin 20 mg.    Disposition: F/u with Dr. Mady or an APP in 6 months, sooner if needed.   Medication Adjustments/Labs and Tests Ordered: Current medicines are reviewed at length with the patient today.  Concerns regarding medicines are outlined above. Medication changes, Labs and Tests ordered today are summarized above and listed in the Patient Instructions accessible in Encounters.   Signed, Bernardino Bring, PA-C 01/20/2024 12:13 PM      [facility] [address] [place], [place] [address] [phone]

## 2024-01-20 ENCOUNTER — Ambulatory Visit: Attending: Physician Assistant | Admitting: Physician Assistant

## 2024-01-20 ENCOUNTER — Encounter: Payer: Self-pay | Admitting: Physician Assistant

## 2024-01-20 VITALS — BP 140/68 | HR 53 | Ht 68.0 in | Wt 179.8 lb

## 2024-01-20 DIAGNOSIS — E785 Hyperlipidemia, unspecified: Secondary | ICD-10-CM

## 2024-01-20 DIAGNOSIS — I10 Essential (primary) hypertension: Secondary | ICD-10-CM | POA: Diagnosis not present

## 2024-01-20 DIAGNOSIS — I5020 Unspecified systolic (congestive) heart failure: Secondary | ICD-10-CM | POA: Diagnosis not present

## 2024-01-20 DIAGNOSIS — I340 Nonrheumatic mitral (valve) insufficiency: Secondary | ICD-10-CM | POA: Diagnosis not present

## 2024-01-20 DIAGNOSIS — I2510 Atherosclerotic heart disease of native coronary artery without angina pectoris: Secondary | ICD-10-CM

## 2024-01-20 NOTE — Patient Instructions (Signed)
 Medication Instructions: Your physician recommends that you continue on your current medications as directed. Please refer to the Current Medication list given to you today.   *If you need a refill on your cardiac medications before your next appointment, please call your pharmacy*  Lab Work: No labs ordered today   If you have labs (blood work) drawn today and your tests are completely normal, you will receive your results only by: MyChart Message (if you have MyChart) OR A paper copy in the mail If you have any lab test that is abnormal or we need to change your treatment, we will call you to review the results.  Testing/Procedures: Your physician has requested that you have an echocardiogram. Echocardiography is a painless test that uses sound waves to create images of your heart. It provides your doctor with information about the size and shape of your heart and how well your heart's chambers and valves are working.   You may receive an ultrasound enhancing agent through an IV if needed to better visualize your heart during the echo. This procedure takes approximately one hour.  There are no restrictions for this procedure.  This will take place at [address] [facility]) [address], [place] [address]  Please note: We ask at that you not bring children with you during ultrasound (echo/ vascular) testing. Due to room size and safety concerns, children are not allowed in the ultrasound rooms during exams. Our front office staff cannot provide observation of children in our lobby area while testing is being conducted. An adult accompanying a patient to their appointment will only be allowed in the ultrasound room at the discretion of the ultrasound technician under special circumstances. We apologize for any inconvenience.    Follow-Up: At [facility], you and your health needs are our priority.  As part of our continuing mission to provide you with exceptional heart  care, our providers are all part of one team.  This team includes your primary Cardiologist (physician) and Advanced Practice Providers or APPs (Physician Assistants and Nurse Practitioners) who all work together to provide you with the care you need, when you need it.  Your next appointment:   6 month(s)  Provider:   You may see Lonni Hanson, MD or one of the following Advanced Practice Providers on your designated Care Team:   Lonni Meager, NP Lesley Maffucci, PA-C Bernardino Bring, PA-C Cadence Cut Off, PA-C Tylene Lunch, NP Barnie Hila, NP    We recommend signing up for the patient portal called MyChart.  Sign up information is provided on this After Visit Summary.  MyChart is used to connect with patients for Virtual Visits (Telemedicine).  Patients are able to view lab/test results, encounter notes, upcoming appointments, etc.  Non-urgent messages can be sent to your provider as well.   To learn more about what you can do with MyChart, go to [email or web address].

## 2024-01-28 ENCOUNTER — Other Ambulatory Visit: Payer: Self-pay | Admitting: Internal Medicine

## 2024-03-10 ENCOUNTER — Ambulatory Visit

## 2024-03-13 ENCOUNTER — Ambulatory Visit: Attending: Physician Assistant

## 2024-03-13 DIAGNOSIS — I340 Nonrheumatic mitral (valve) insufficiency: Secondary | ICD-10-CM

## 2024-03-13 LAB — ECHOCARDIOGRAM COMPLETE
AR max vel: 1.91 cm2
AV Area VTI: 2.08 cm2
AV Area mean vel: 1.92 cm2
AV Mean grad: 5 mmHg
AV Peak grad: 9.6 mmHg
Ao pk vel: 1.55 m/s
Area-P 1/2: 3.12 cm2
S' Lateral: 2.8 cm

## 2024-03-14 ENCOUNTER — Ambulatory Visit: Payer: Self-pay | Admitting: Student

## 2024-07-19 ENCOUNTER — Ambulatory Visit: Payer: Self-pay | Admitting: Physician Assistant
